# Patient Record
Sex: Female | Born: 1955 | Race: White | Hispanic: No | Marital: Married | Smoking: Never smoker
Health system: Southern US, Community
[De-identification: ages and names within clinical notes are randomized; demographics above are authoritative.]

## PROBLEM LIST (undated history)

## (undated) DIAGNOSIS — T7840XA Allergy, unspecified, initial encounter: Secondary | ICD-10-CM

## (undated) DIAGNOSIS — I671 Cerebral aneurysm, nonruptured: Secondary | ICD-10-CM

## (undated) DIAGNOSIS — L719 Rosacea, unspecified: Secondary | ICD-10-CM

## (undated) DIAGNOSIS — K219 Gastro-esophageal reflux disease without esophagitis: Secondary | ICD-10-CM

## (undated) DIAGNOSIS — Z8669 Personal history of other diseases of the nervous system and sense organs: Secondary | ICD-10-CM

## (undated) DIAGNOSIS — J302 Other seasonal allergic rhinitis: Secondary | ICD-10-CM

## (undated) HISTORY — DX: Personal history of other diseases of the nervous system and sense organs: Z86.69

## (undated) HISTORY — PX: TUBAL LIGATION: SHX77

## (undated) HISTORY — PX: BUNIONECTOMY: SHX129

## (undated) HISTORY — DX: Gastro-esophageal reflux disease without esophagitis: K21.9

## (undated) HISTORY — DX: Other seasonal allergic rhinitis: J30.2

## (undated) HISTORY — DX: Rosacea, unspecified: L71.9

## (undated) HISTORY — DX: Allergy, unspecified, initial encounter: T78.40XA

## (undated) HISTORY — PX: CARPAL TUNNEL RELEASE: SHX101

## (undated) HISTORY — DX: Cerebral aneurysm, nonruptured: I67.1

---

## 1999-01-17 ENCOUNTER — Other Ambulatory Visit: Admission: RE | Admit: 1999-01-17 | Discharge: 1999-01-17 | Payer: Self-pay | Admitting: Obstetrics and Gynecology

## 2000-11-18 ENCOUNTER — Other Ambulatory Visit: Admission: RE | Admit: 2000-11-18 | Discharge: 2000-11-18 | Payer: Self-pay | Admitting: *Deleted

## 2001-12-29 ENCOUNTER — Emergency Department (HOSPITAL_COMMUNITY): Admission: EM | Admit: 2001-12-29 | Discharge: 2001-12-30 | Payer: Self-pay | Admitting: Emergency Medicine

## 2002-02-16 ENCOUNTER — Encounter: Payer: Self-pay | Admitting: Family Medicine

## 2002-02-16 LAB — CONVERTED CEMR LAB
Blood Glucose, Fasting: 89 mg/dL
RBC count: 4.79 10*6/uL
WBC, blood: 5.2 10*3/uL

## 2002-06-19 ENCOUNTER — Other Ambulatory Visit: Admission: RE | Admit: 2002-06-19 | Discharge: 2002-06-19 | Payer: Self-pay | Admitting: Obstetrics and Gynecology

## 2002-07-07 ENCOUNTER — Encounter: Admission: RE | Admit: 2002-07-07 | Discharge: 2002-07-07 | Payer: Self-pay | Admitting: Family Medicine

## 2002-07-07 ENCOUNTER — Encounter: Payer: Self-pay | Admitting: Family Medicine

## 2002-10-08 HISTORY — PX: APPENDECTOMY: SHX54

## 2002-11-04 ENCOUNTER — Encounter (INDEPENDENT_AMBULATORY_CARE_PROVIDER_SITE_OTHER): Payer: Self-pay | Admitting: *Deleted

## 2002-11-04 ENCOUNTER — Inpatient Hospital Stay (HOSPITAL_COMMUNITY): Admission: EM | Admit: 2002-11-04 | Discharge: 2002-11-06 | Payer: Self-pay | Admitting: Emergency Medicine

## 2002-12-16 ENCOUNTER — Encounter: Payer: Self-pay | Admitting: Family Medicine

## 2002-12-16 LAB — CONVERTED CEMR LAB
Blood Glucose, Fasting: 77 mg/dL
RBC count: 4.6 10*6/uL
WBC, blood: 5.2 10*3/uL

## 2004-02-11 ENCOUNTER — Other Ambulatory Visit: Admission: RE | Admit: 2004-02-11 | Discharge: 2004-02-11 | Payer: Self-pay | Admitting: Obstetrics and Gynecology

## 2004-02-17 ENCOUNTER — Encounter: Payer: Self-pay | Admitting: Family Medicine

## 2004-02-17 LAB — CONVERTED CEMR LAB: TSH: 1.27 microintl units/mL

## 2005-01-15 ENCOUNTER — Encounter: Admission: RE | Admit: 2005-01-15 | Discharge: 2005-01-15 | Payer: Self-pay | Admitting: Obstetrics and Gynecology

## 2006-09-10 ENCOUNTER — Ambulatory Visit: Payer: Self-pay | Admitting: Family Medicine

## 2006-10-11 ENCOUNTER — Ambulatory Visit: Payer: Self-pay | Admitting: Family Medicine

## 2008-04-05 ENCOUNTER — Encounter: Payer: Self-pay | Admitting: Family Medicine

## 2008-05-13 ENCOUNTER — Encounter: Admission: RE | Admit: 2008-05-13 | Discharge: 2008-05-13 | Payer: Self-pay | Admitting: Obstetrics and Gynecology

## 2008-06-30 ENCOUNTER — Ambulatory Visit: Payer: Self-pay | Admitting: Family Medicine

## 2008-07-05 ENCOUNTER — Encounter: Payer: Self-pay | Admitting: Family Medicine

## 2008-07-05 DIAGNOSIS — N83209 Unspecified ovarian cyst, unspecified side: Secondary | ICD-10-CM | POA: Insufficient documentation

## 2008-07-05 DIAGNOSIS — E7800 Pure hypercholesterolemia, unspecified: Secondary | ICD-10-CM | POA: Insufficient documentation

## 2008-07-05 DIAGNOSIS — K219 Gastro-esophageal reflux disease without esophagitis: Secondary | ICD-10-CM | POA: Insufficient documentation

## 2008-07-05 DIAGNOSIS — E559 Vitamin D deficiency, unspecified: Secondary | ICD-10-CM | POA: Insufficient documentation

## 2009-01-24 ENCOUNTER — Ambulatory Visit: Payer: Self-pay | Admitting: Family Medicine

## 2009-01-24 DIAGNOSIS — F411 Generalized anxiety disorder: Secondary | ICD-10-CM | POA: Insufficient documentation

## 2009-01-24 DIAGNOSIS — G5600 Carpal tunnel syndrome, unspecified upper limb: Secondary | ICD-10-CM | POA: Insufficient documentation

## 2009-01-27 ENCOUNTER — Encounter: Payer: Self-pay | Admitting: Family Medicine

## 2009-02-28 ENCOUNTER — Ambulatory Visit: Payer: Self-pay | Admitting: Family Medicine

## 2009-05-31 ENCOUNTER — Ambulatory Visit: Payer: Self-pay | Admitting: Family Medicine

## 2009-06-22 ENCOUNTER — Ambulatory Visit (HOSPITAL_BASED_OUTPATIENT_CLINIC_OR_DEPARTMENT_OTHER): Admission: RE | Admit: 2009-06-22 | Discharge: 2009-06-22 | Payer: Self-pay | Admitting: *Deleted

## 2009-08-03 ENCOUNTER — Ambulatory Visit (HOSPITAL_BASED_OUTPATIENT_CLINIC_OR_DEPARTMENT_OTHER): Admission: RE | Admit: 2009-08-03 | Discharge: 2009-08-03 | Payer: Self-pay | Admitting: *Deleted

## 2009-08-23 ENCOUNTER — Ambulatory Visit: Payer: Self-pay | Admitting: Family Medicine

## 2009-09-07 ENCOUNTER — Ambulatory Visit: Payer: Self-pay | Admitting: Family Medicine

## 2009-11-14 ENCOUNTER — Telehealth: Payer: Self-pay | Admitting: Family Medicine

## 2010-05-16 ENCOUNTER — Encounter (INDEPENDENT_AMBULATORY_CARE_PROVIDER_SITE_OTHER): Payer: Self-pay | Admitting: *Deleted

## 2010-05-30 ENCOUNTER — Ambulatory Visit: Payer: Self-pay | Admitting: Family Medicine

## 2010-05-31 LAB — CONVERTED CEMR LAB
ALT: 12 units/L (ref 0–35)
AST: 18 units/L (ref 0–37)
Albumin: 4.1 g/dL (ref 3.5–5.2)
Alkaline Phosphatase: 71 units/L (ref 39–117)
BUN: 14 mg/dL (ref 6–23)
Bilirubin, Direct: 0.1 mg/dL (ref 0.0–0.3)
CO2: 30 meq/L (ref 19–32)
Calcium: 9.7 mg/dL (ref 8.4–10.5)
Chloride: 105 meq/L (ref 96–112)
Cholesterol: 220 mg/dL — ABNORMAL HIGH (ref 0–200)
Creatinine, Ser: 0.8 mg/dL (ref 0.4–1.2)
Direct LDL: 147.6 mg/dL
GFR calc non Af Amer: 85.06 mL/min (ref >60)
Glucose, Bld: 85 mg/dL (ref 70–99)
HDL: 50 mg/dL (ref >39.00)
Potassium: 4 meq/L (ref 3.5–5.1)
Sodium: 144 meq/L (ref 135–145)
Total Bilirubin: 0.9 mg/dL (ref 0.3–1.2)
Total CHOL/HDL Ratio: 4
Total Protein: 6.7 g/dL (ref 6.0–8.3)
Triglycerides: 143 mg/dL (ref 0.0–149.0)
VLDL: 28.6 mg/dL (ref 0.0–40.0)

## 2010-06-14 ENCOUNTER — Encounter: Admission: RE | Admit: 2010-06-14 | Discharge: 2010-06-14 | Payer: Self-pay | Admitting: Family Medicine

## 2010-11-05 LAB — CONVERTED CEMR LAB: Pap Smear: NORMAL

## 2010-11-09 NOTE — Progress Notes (Signed)
Summary: Rx Zoloft  Phone Note Refill Request Call back at [phone] Message from:  [facility] on November 14, 2009 8:00 AM  Refills Requested: Medication #1:  ZOLOFT 100 MG TABS one tab by mouth qd   Last Refilled: 09/07/2009 Received e-scribe refill request.   Method Requested: Electronic Initial call taken by: Sydell Axon LPN,  November 14, 2009 8:00 AM    Prescriptions: ZOLOFT 100 MG TABS (SERTRALINE HCL) one tab by mouth qd  #30 x 12   Entered and Authorized by:   Shaune Leeks MD   Signed by:   Shaune Leeks MD on 11/14/2009   Method used:   Electronically to        [facility] (retail)       [address]       [place], [place]  [address]       Ph: [phone]       Fax: [phone]   RxID:   [id number]

## 2010-11-09 NOTE — Letter (Signed)
Summary: Maria Aguirre letter  Maria Aguirre at [facility]  [address] [place], [place] [address]   Phone: [phone]  Fax: [phone]       05/16/2010 MRN: [id number]  Maria Aguirre [address] [place], [place]  [address]  Dear [name],  [facility] and [facility] announce the retirement of Arta Silence, M.D., from full-time practice at the [facility] office effective April 06, 2010 and his plans of returning part-time.  It is important to Dr. [name] and to our practice that you understand that [facility] has seven physicians in our office for your health care needs.  We will continue to offer the same exceptional care that you have today.    Dr. [name] has spoken to many of you about his plans for retirement and returning part-time in the fall.   We will continue to work with you through the transition to schedule appointments for you in the office and meet the high standards that [name] is committed to.   Again, it is with great pleasure that we share the news that Dr. [name] will return to [facility] in October of 2011 with a reduced schedule.    If you have any questions, or would like to request an appointment with one of our physicians, please call us at [phone] and press the option for Scheduling an appointment.  We take pleasure in providing you with excellent patient care and look forward to seeing you at your next office visit.  Our [facility] Physicians are:  Tillman Abide, M.D. Laurita Quint, M.D. Roxy Manns, M.D. Kerby Nora, M.D. Hannah Beat, M.D. Ruthe Mannan, M.D. We proudly welcomed Raechel Ache, M.D. and Eustaquio Boyden, M.D. to the practice in July/August 2011.  Sincerely,  [facility]

## 2010-11-09 NOTE — Assessment & Plan Note (Signed)
Summary: establish from [facility]/alc wants female dr.   Gaylyn Rong Signs:  Patient profile:   55 year old female Height:      65 inches Weight:      180.38 pounds BMI:     30.13 Temp:     98.5 degrees F oral Pulse rate:   76 / minute Pulse rhythm:   regular BP sitting:   120 / 80  (left arm) Cuff size:   regular  Vitals Entered By: Linde Gillis CMA Duncan Dull) (May 30, 2010 8:55 AM) CC: establish care from Dr. [name]   History of Present Illness: 55 yo female new to me here for CPX.  Well woman- had been going to see Dr. Billy Coast for years, last pap was in 02/2009.  No h/o abnormal pap smears, sexually active with her husband only. Never had a colonoscopy and she is due for mammogram and labs.  Anxiety/depression- was on Zoloft 100 mg daily but felt that she was "crying on the inside" while she was on it.  Feels much better since she weaned herself off of it.  Going through a lot of stress right now, her mother is sick and her father recently left her mother.  Seeing Madaline Guthrie for psychotherapy which has been really helping.  Current Medications (verified): 1)  Niacin Cr 500 Mg Cr-Caps (Niacin) .Marland Kitchen.. 1 Daily By Mouth 2)  Flax Seed Oil 1000 Mg Caps (Flaxseed (Linseed)) .Marland Kitchen.. 1 Daily By Mouth 3)  Bayer Aspirin Ec Low Dose 81 Mg Tbec (Aspirin) .Marland Kitchen.. 1 Daily By Mouth 4)  Vitamin D3 400 Unit Tabs (Cholecalciferol) .... Take One By Mouth Daily 5)  Calcium-Vitamin D 500-200 Mg-Unit Tabs (Calcium Carbonate-Vitamin D) .... Take One By Mouth Daily 6)  Super B Complex  Tabs (B Complex-C) .... Take One By Mouth Daily  Allergies (verified): No Known Drug Allergies  Past History:  Past Surgical History: Last updated: 07/05/2008 CT PELVIS W/   APPENDICITIS   3 CM SIMPLE APPEARING R OVARIAN CYST 11/04/2002 DEXA OSTEOPENIA SPINE( 04/05/2008)  Family History: Last updated: 07/05/2008 Father: ALIVE AT 13 YOA PROSTATE HYPERTROPHY (DR. Reuel Boom) Mother: ALIVE 5 YOA ELEVATED CHOLESTEROL BROTHER A 37    BROTHER A 50  SISTER A 41  CV: + P UNCLE DECEASED AT 72 YOA OF MI// PGF MI 97 YOA // PGM  ANGINA HBP: NEGATIVE DM: NEGATIVE GOUT/ARTHRITIS:  PROSTATE CANCER: NEGATIVE BREAST/OVARIAN/UTERINE CANCER: NEGATIVE COLON CANCER: + MGM DEPRESSION + SELF ? ETOH/DRUG ABUSE: NEGATIVE OTHER: POSITIVE STROKE MGF PARKINSONS: + MGF  Social History: Last updated: 07/05/2008 Marital Status: Married  LIVES WITH HUSBAND Children: 1 CHILD  Occupation: POST OFFICE MANAGER  Risk Factors: Smoking Status: never (07/05/2008)  Review of Systems      See HPI General:  Denies malaise. Eyes:  Denies blurring. ENT:  Denies difficulty swallowing. CV:  Denies chest pain or discomfort. Resp:  Denies shortness of breath. GI:  Denies abdominal pain, bloody stools, and change in bowel habits. GU:  Denies abnormal vaginal bleeding, discharge, and dysuria. MS:  Denies joint pain, joint redness, and joint swelling. Derm:  Denies rash. Neuro:  Denies headaches. Psych:  Denies panic attacks, sense of great danger, suicidal thoughts/plans, thoughts of violence, unusual visions or sounds, and thoughts /plans of harming others. Endo:  Denies cold intolerance and heat intolerance. Heme:  Denies abnormal bruising.  Physical Exam  General:  Well-developed,well-nourished,in no acute distress; alert,appropriate and cooperative throughout examination, relaxed and at ease. Head:  Normocephalic and atraumatic without obvious abnormalities. No apparent  alopecia or balding. Eyes:  Conjunctiva clear bilaterally.  Ears:  External ear exam shows no significant lesions or deformities.  Otoscopic examination reveals clear canals, tympanic membranes are intact bilaterally without bulging, retraction, inflammation or discharge. Hearing is grossly normal bilaterally. Nose:  External nasal examination shows no deformity or inflammation. Nasal mucosa are pink and moist without lesions or exudates. Mouth:  Oral mucosa and oropharynx  without lesions or exudates.  Teeth in good repair. Neck:  No deformities, masses, or tenderness noted. Lungs:  Normal respiratory effort, chest expands symmetrically. Lungs are clear to auscultation, no crackles or wheezes. Heart:  Normal rate and regular rhythm. S1 and S2 normal without gallop, murmur, click, rub or other extra sounds. Abdomen:  Bowel sounds positive,abdomen soft and non-tender without masses, organomegaly or hernias noted. Msk:  No deformity or scoliosis noted of thoracic or lumbar spine.   Extremities:  No clubbing, cyanosis, edema, or deformity noted with normal full range of motion of all joints.   Neurologic:  No cranial nerve deficits noted. Station and gait are normal. Plantar reflexes are down-going bilaterally. DTRs are symmetrical throughout. Sensory, motor and coordinative functions appear intact. Skin:  Intact without suspicious lesions or rashes Psych:  Cognition and judgment appear intact. Alert and cooperative with normal attention span and concentration. No apparent delusions, illusions, hallucinations   Impression & Recommendations:  Problem # 1:  PREVENTIVE HEALTH CARE (ICD-V70.0) Reviewed preventive care protocols, scheduled due services, and updated immunizations Discussed nutrition, exercise, diet, and healthy lifestyle.  Set up mammogram and colonscopy today. BMET, FLP, hepatic panel today. Orders: Venipuncture ([address]) TLB-BMP (Basic Metabolic Panel-BMET) (80048-METABOL)  Problem # 2:  ANXIETY STATE, UNSPECIFIED (ICD-300.00) Assessment: Improved Appears well controlled off Zoloft.  I encouraged her to continue psychotherapy with Madaline Guthrie. The following medications were removed from the medication list:    Zoloft 100 Mg Tabs (Sertraline hcl) ..... One tab by mouth qd  Complete Medication List: 1)  Niacin Cr 500 Mg Cr-caps (Niacin) .Marland Kitchen.. 1 daily by mouth 2)  Flax Seed Oil 1000 Mg Caps (Flaxseed (linseed)) .Marland Kitchen.. 1 daily by mouth 3)  Bayer Aspirin  Ec Low Dose 81 Mg Tbec (Aspirin) .Marland Kitchen.. 1 daily by mouth 4)  Vitamin D3 400 Unit Tabs (Cholecalciferol) .... Take one by mouth daily 5)  Calcium-vitamin D 500-200 Mg-unit Tabs (Calcium carbonate-vitamin d) .... Take one by mouth daily 6)  Super B Complex Tabs (B complex-c) .... Take one by mouth daily  Other Orders: Gastroenterology Referral (GI) Radiology Referral (Radiology) TLB-Lipid Panel (80061-LIPID) TLB-Hepatic/Liver Function Pnl (80076-HEPATIC)  Patient Instructions: 1)  Please stop by to see Shirlee Limerick on your way out.  Current Allergies (reviewed today): No known allergies  Last PAP:  Normal, Satisfactory (02/23/2009 2:58:41 PM) PAP Next Due:  2 yr   Prevention & Chronic Care Immunizations   Influenza vaccine: Fluvax 3+  (08/23/2009)   Influenza vaccine due: 08/23/2010    Tetanus booster: 05/31/2009: Tdap   Tetanus booster due: 06/01/2019    Pneumococcal vaccine: Not documented  Colorectal Screening   Hemoccult: Not documented   Hemoccult action/deferral: Not indicated  (05/30/2010)    Colonoscopy: Not documented   Colonoscopy action/deferral: GI referral  (05/30/2010)  Other Screening   Pap smear: Normal, Satisfactory  (02/23/2009)   Pap smear action/deferral: Deferred-2 yr interval  (05/30/2010)   Pap smear due: 02/24/2011    Mammogram: Normal Bilateral  (05/17/2009)   Mammogram action/deferral: Ordered  (05/30/2010)   Mammogram due: 05/2010   Smoking status: never  (  07/05/2008)  Lipids   Total Cholesterol: Not documented   Lipid panel action/deferral: Lipid Panel ordered   LDL: Not documented   LDL Direct: Not documented   HDL: Not documented   Triglycerides: Not documented    SGOT (AST): Not documented   BMP action: Ordered   SGPT (ALT): Not documented   Alkaline phosphatase: Not documented   Total bilirubin: Not documented  Self-Management Support :    Lipid self-management support: Not documented    Nursing Instructions: GI referral for  screening colonoscopy (see order) Schedule screening mammogram (see order)

## 2011-01-11 LAB — POCT HEMOGLOBIN-HEMACUE: Hemoglobin: 14.1 g/dL (ref 12.0–15.0)

## 2011-01-12 LAB — POCT HEMOGLOBIN-HEMACUE: Hemoglobin: 14.6 g/dL (ref 12.0–15.0)

## 2011-02-23 NOTE — Op Note (Signed)
NAMEETHYLE, TIEDT NO.:  [id number]   MEDICAL RECORD NO.:  [id number]                   PATIENT TYPE:  INP   LOCATION:  1843                                 FACILITY:  [facility]   PHYSICIAN:  Thornton Park. Daphine Deutscher, M.D.             DATE OF BIRTH:  03/08/1956   DATE OF PROCEDURE:  11/04/2002  DATE OF DISCHARGE:                                 OPERATIVE REPORT   PREOPERATIVE DIAGNOSIS:  Acute appendicitis.   POSTOPERATIVE DIAGNOSES:  1. Gangrenous appendicitis.  2. Three centimeter right ovarian cyst.   OPERATION PERFORMED:   SURGEON:  Matthew B. Daphine Deutscher, M.D.   ANESTHESIA:  General endotracheal.   DESCRIPTION OF OPERATION:  The patient got sick at about 0100 hours on  November 04, 2002.  She presented to the ED at 0630 hours on January 28 and  was seen in the ED, and felt to have a significant abdominal pain.  CT scan  was ordered, which indicated she had an appendicitis at 1600 hours and at  that time she was posted for a laparoscopic appendectomy.  Because the  schedule was full I was unable to get the case started before 2130 hours.  The patient at that time was taken to room 16 and given general anesthesia.  Preoperatively she had received a gram of Mefoxin.   The abdomen was prepped with Betadine and draped sterilely.  A transverse  incision was made through an old laparoscopy incision in the navel and I  entered through a longitudinal slit in the fascia without difficulty.  The  Hasson trocar was inserted.  The abdomen was insufflated and a 1011 was  placed obliquely in the left lower quadrant and a 5 mm in the right upper  quadrant.   I surveyed the abdomen and she had an obvious appendicitis and she had about  a 3 cm right ovarian cyst.  It appeared to be regular simple cyst grossly.   The appendix was stuck down and twisted back on itself, and was quite turgid  and purple, and it is depicted in the chart with the pictures taken.  First  I got around the base and transected this with an endo-GIA.  I mobilized  that and then went across the mesentery with a combination harmonic scalpel  and finally with the endo-GIA.  Once it was detached it was placed in a bag  and brought out through the umbilicus.  It had a very feted odor as I was  removing it.  There was no gross spillage or perforation; however, it was  necrotic as evidenced in the picture.   Following removal I went in an irrigated this area well.  No bleeding was  noted with a three-minute irrigant.  I then repaired the umbilical defect  under direct vision with the laparoscope using two sutures of 0-Vicryl into  the fascia.  A survey again  surveyed the abdomen.  Everything appeared to be in order.  I  deflated the abdomen and removed the trocars, and closed the skin with 4-0  Vicryls.   The patient seemed to tolerated the procedure well and was taken to the  recovery room in satisfactory condition.                                                 Thornton Park Daphine Deutscher, M.D.    MBM/MEDQ  D:  11/04/2002  T:  11/05/2002  Job:  [id number]

## 2012-02-01 ENCOUNTER — Ambulatory Visit (INDEPENDENT_AMBULATORY_CARE_PROVIDER_SITE_OTHER): Payer: No Typology Code available for payment source | Admitting: Family Medicine

## 2012-02-01 ENCOUNTER — Telehealth: Payer: Self-pay

## 2012-02-01 VITALS — BP 113/72 | HR 72 | Temp 98.2°F | Resp 16 | Ht 64.0 in | Wt 172.0 lb

## 2012-02-01 DIAGNOSIS — J329 Chronic sinusitis, unspecified: Secondary | ICD-10-CM

## 2012-02-01 DIAGNOSIS — J310 Chronic rhinitis: Secondary | ICD-10-CM

## 2012-02-01 DIAGNOSIS — R059 Cough, unspecified: Secondary | ICD-10-CM

## 2012-02-01 DIAGNOSIS — R05 Cough: Secondary | ICD-10-CM

## 2012-02-01 MED ORDER — FLUTICASONE PROPIONATE 50 MCG/ACT NA SUSP: 2.0000 | Freq: Every day | NASAL | Status: DC

## 2012-02-01 MED ORDER — BENZONATATE 100 MG PO CAPS: 200.0000 mg | ORAL_CAPSULE | Freq: Two times a day (BID) | ORAL | Status: AC | PRN

## 2012-02-01 MED ORDER — AZITHROMYCIN 250 MG PO TABS: ORAL_TABLET | ORAL | Status: AC

## 2012-02-01 NOTE — Progress Notes (Signed)
  [facility]:  Office Visit  Chief Complaint:  Chief Complaint  Patient presents with  . Nasal Congestion    x 1 week  . Cough    HPI: Maria Aguirre is a 56 y.o. female who complains of 1 week h/o sinus congestion. No fevers, chills. Dry cough. Feels chest pressure. Froggy throat, losing voice. SInus drainage and pain. Tried Dayquil, nyquil with minimal relief. HAs had flu vaccine.   History reviewed. No pertinent past medical history. Past Surgical History  Procedure Date  . Appendectomy    History   Social History  . Marital Status: Married    Spouse Name: N/A    Number of Children: N/A  . Years of Education: N/A   Social History Main Topics  . Smoking status: Never Smoker   . Smokeless tobacco: None  . Alcohol Use: No  . Drug Use: No  . Sexually Active: None   Other Topics Concern  . None   Social History Narrative  . None   Family History  Problem Relation Age of Onset  . Heart disease Father    No Known Allergies Prior to Admission medications   Not on File     ROS: The patient denies fevers, chills, night sweats, unintentional weight loss, chest pain, palpitations, wheezing, dyspnea on exertion, nausea, vomiting, abdominal pain, dysuria, hematuria, melena, numbness, weakness, or tingling.  All other systems have been reviewed and were otherwise negative with the exception of those mentioned in the HPI and as above.    PHYSICAL EXAM: Filed Vitals:   02/01/12 1548  BP: 113/72  Pulse: 72  Temp: 98.2 F (36.8 C)  Resp: 16  Spo2 98% Filed Vitals:   02/01/12 1548  Height: 5\' 4"  (1.626 m)  Weight: 172 lb (78.019 kg)   Body mass index is 29.52 kg/(m^2).  General: Alert, no acute distress HEENT:  Normocephalic, atraumatic, oropharynx patent. Tm nl, boggy red nares, + sinus tenderness bilateral frontal and max Cardiovascular:  Regular rate and rhythm, no rubs murmurs or gallops.  No Carotid bruits, radial pulse intact. No pedal  edema.  Respiratory: Clear to auscultation bilaterally.  No wheezes, rales, or rhonchi.  No cyanosis, no use of accessory musculature GI: No organomegaly, abdomen is soft and non-tender, positive bowel sounds.  No masses. Skin: No rashes. Neurologic: Facial musculature symmetric. Psychiatric: Patient is appropriate throughout our interaction. Lymphatic: No cervical lymphadenopathy Musculoskeletal: Gait intact.   LABS:    EKG/XRAY:   Primary read interpreted by Dr. Conley Rolls at [facility].   ASSESSMENT/PLAN: Encounter Diagnoses  Name Primary?  . Sinusitis Yes  . Rhinitis   . Cough    1. Rx Z-pack 2. Rx Flonase 3. Rx Tessalon Graylin Shiver PHUONG, DO 02/01/2012 4:30 PM

## 2013-02-17 ENCOUNTER — Ambulatory Visit: Payer: No Typology Code available for payment source | Admitting: Family Medicine

## 2013-02-17 VITALS — BP 120/80 | HR 90 | Temp 98.5°F | Resp 16 | Ht 64.0 in | Wt 168.0 lb

## 2013-02-17 DIAGNOSIS — W57XXXA Bitten or stung by nonvenomous insect and other nonvenomous arthropods, initial encounter: Secondary | ICD-10-CM

## 2013-02-17 DIAGNOSIS — T148 Other injury of unspecified body region: Secondary | ICD-10-CM

## 2013-02-17 MED ORDER — DOXYCYCLINE HYCLATE 100 MG PO CAPS: 100.0000 mg | ORAL_CAPSULE | Freq: Two times a day (BID) | ORAL | Status: DC

## 2013-02-17 MED ORDER — DESONIDE 0.05 % EX CREA: TOPICAL_CREAM | Freq: Two times a day (BID) | CUTANEOUS | Status: DC

## 2013-02-17 NOTE — Progress Notes (Signed)
[facility] [address] [place] [address] [phone]  Date:  02/17/2013   Name:  Maria Aguirre   DOB:  [birth year]   MRN:  [id number]  PCP:  Eustaquio Boyden, MD    Chief Complaint: Tick Bite   History of Present Illness:  Maria Aguirre is a 57 y.o. very pleasant female patient who presents with the following:  She pulled a tick off her neck 2 days ago.  It is a dog tick- not engorged.  She brought it with her today and it was indeed a dog tick.    She felt tired yesterday, but she did heavy yard work over the weekend as well.  She does part- time landscaping work.    She has not had a fever, but the area where she was bitten is warm and slightly tender.   Otherwise she is generally healthy.  NKDA She would like a RF of her desonide cream to use on the bite., as well on occasional skin irritation   Patient Active Problem List   Diagnosis Date Noted  . ANXIETY STATE, UNSPECIFIED 01/24/2009  . CARPAL TUNNEL SYNDROME, BILATERAL 01/24/2009  . UNSPECIFIED VITAMIN D DEFICIENCY 07/05/2008  . HYPERCHOLESTEROLEMIA 07/05/2008  . GERD 07/05/2008  . OVARIAN CYST 3CM RIGHT 07/05/2008    Past Medical History  Diagnosis Date  . Allergy     Past Surgical History  Procedure Laterality Date  . Appendectomy    . Tubal ligation    . Bunionectomy      History  Substance Use Topics  . Smoking status: Never Smoker   . Smokeless tobacco: Not on file  . Alcohol Use: No    Family History  Problem Relation Age of Onset  . Heart disease Father     No Known Allergies  Medication list has been reviewed and updated.  No current outpatient prescriptions on file prior to visit.   No current facility-administered medications on file prior to visit.    Review of Systems:  As per HPI- otherwise negative.   Physical Examination: Filed Vitals:   02/17/13 1216  BP: 120/80  Pulse: 90  Temp: 98.5 F (36.9 C)  Resp: 16   Filed Vitals:   02/17/13  1216  Height: 5\' 4"  (1.626 m)  Weight: 168 lb (76.204 kg)   Body mass index is 28.82 kg/(m^2). Ideal Body Weight: Weight in (lb) to have BMI = 25: 145.3  GEN: WDWN, NAD, Non-toxic, A & O x 3 HEENT: Atraumatic, Normocephalic. Neck supple. No masses, No LAD.  No oral lesions.   Ears and Nose: No external deformity. CV: RRR, No M/G/R. No JVD. No thrill. No extra heart sounds. PULM: CTA B, no wheezes, crackles, rhonchi. No retractions. No resp. distress. No accessory muscle use. ABD: S, NT, ND EXTR: No c/c/e NEURO Normal gait.  PSYCH: Normally interactive. Conversant. Not depressed or anxious appearing.  Calm demeanor.  No rash on hands or feet.  On the right back of her neck is a slightly tender and red insect bite. No cellulitis or abscess.    Assessment and Plan: Tick bite - Plan: doxycycline (VIBRAMYCIN) 100 MG capsule, desonide (DESOWEN) 0.05 % cream  Reassurance that most tick bites do not lead to serious consequences.  Refilled her desonide cream to use prn.   Doxycycline rx sent to drug store to hold- she may use this if she develops any sign or sx of RMSF, which we went over in detail.  In this case she is also to call or RTC right away.    Signed Abbe Amsterdam, MD

## 2013-02-17 NOTE — Patient Instructions (Addendum)
I do not think you will suffer any ill effects from the tick bite.  If you develop any symptoms such as fever, chills, or other flu- like symptoms please start the doxycycline and let me know right away.    Be careful of sun sensitivity if you need to use the doxycycline.

## 2013-12-30 ENCOUNTER — Ambulatory Visit (INDEPENDENT_AMBULATORY_CARE_PROVIDER_SITE_OTHER): Payer: No Typology Code available for payment source | Admitting: Family Medicine

## 2013-12-30 VITALS — BP 140/78 | HR 79 | Temp 98.7°F | Resp 16 | Ht 64.5 in | Wt 172.4 lb

## 2013-12-30 DIAGNOSIS — J209 Acute bronchitis, unspecified: Secondary | ICD-10-CM

## 2013-12-30 MED ORDER — GUAIFENESIN-CODEINE 100-10 MG/5ML PO SOLN: 5.0000 mL | ORAL | Status: DC | PRN

## 2013-12-30 MED ORDER — AZITHROMYCIN 250 MG PO TABS
ORAL_TABLET | ORAL | Status: DC
Start: 2013-12-30 — End: 2015-01-10

## 2013-12-30 NOTE — Progress Notes (Signed)
   Subjective:    Patient ID: Maria Aguirre, female    DOB: 10-14-1955, 58 y.o.   MRN: [id number]  HPI Patient presents with illness. Has been sick for one week. Had fever last week, thought she had the flu. Coughing a lot and still with sore throat. Cough is dry. Chest now getting tight and not getting better. Taking cough drops and gargling. Tried OTC cough/cold meds. No h/o lung disease. Mild wheezing with strenuous activity. No fevers now. No vomiting or diarrhea. No sick contacts, but works at [employer].  Review of Systems  All other systems reviewed and are negative.       Objective:   Physical Exam  Constitutional: She is oriented to person, place, and time. She appears well-developed and well-nourished. No distress.  HENT:  Head: Normocephalic and atraumatic.  Right Ear: External ear normal.  Left Ear: External ear normal.  Mouth/Throat: Oropharynx is clear and moist. No oropharyngeal exudate.  Eyes: Conjunctivae are normal. Pupils are equal, round, and reactive to light. No scleral icterus.  Neck: Normal range of motion. Neck supple.  Cardiovascular: Normal rate, regular rhythm, normal heart sounds and intact distal pulses.   Pulmonary/Chest: Effort normal and breath sounds normal. No respiratory distress. She has no wheezes. She has no rales.  Musculoskeletal: Normal range of motion.  Lymphadenopathy:    She has no cervical adenopathy.  Neurological: She is alert and oriented to person, place, and time.  Skin: Skin is warm and dry. She is not diaphoretic.  Psychiatric: She has a normal mood and affect. Her behavior is normal.      Assessment & Plan:  #1. Viral illness/Bronchitis - Continue conservative care  - Cough medicine  - Mucinex D  - Tylenol/Motrin/cough drops - Start zpak if not improving by 10 days into illness on Saturday - F/u prn

## 2013-12-30 NOTE — Patient Instructions (Signed)
Thank you for coming in today  I think that you have a virus and bronchitis Try cough medicine at night Take Mucinex D during the day Start azithromycin if you have not turned the corner by Saturday Continue cough drops, cold fluids, ibuprofen/tylenol  Followup for recurrent fever, worsening breathing problems, vomiting, worsening illness  Bronchitis Bronchitis is inflammation of the airways that extend from the windpipe into the lungs (bronchi). The inflammation often causes mucus to develop, which leads to a cough. If the inflammation becomes severe, it may cause shortness of breath. CAUSES  Bronchitis may be caused by:   Viral infections.   Bacteria.   Cigarette smoke.   Allergens, pollutants, and other irritants.  SIGNS AND SYMPTOMS  The most common symptom of bronchitis is a frequent cough that produces mucus. Other symptoms include:  Fever.   Body aches.   Chest congestion.   Chills.   Shortness of breath.   Sore throat.  DIAGNOSIS  Bronchitis is usually diagnosed through a medical history and physical exam. Tests, such as chest X-rays, are sometimes done to rule out other conditions.  TREATMENT  You may need to avoid contact with whatever caused the problem (smoking, for example). Medicines are sometimes needed. These may include:  Antibiotics. These may be prescribed if the condition is caused by bacteria.  Cough suppressants. These may be prescribed for relief of cough symptoms.   Inhaled medicines. These may be prescribed to help open your airways and make it easier for you to breathe.   Steroid medicines. These may be prescribed for those with recurrent (chronic) bronchitis. HOME CARE INSTRUCTIONS  Get plenty of rest.   Drink enough fluids to keep your urine clear or pale yellow (unless you have a medical condition that requires fluid restriction). Increasing fluids may help thin your secretions and will prevent dehydration.   Only take  over-the-counter or prescription medicines as directed by your health care provider.  Only take antibiotics as directed. Make sure you finish them even if you start to feel better.  Avoid secondhand smoke, irritating chemicals, and strong fumes. These will make bronchitis worse. If you are a smoker, quit smoking. Consider using nicotine gum or skin patches to help control withdrawal symptoms. Quitting smoking will help your lungs heal faster.   Put a cool-mist humidifier in your bedroom at night to moisten the air. This may help loosen mucus. Change the water in the humidifier daily. You can also run the hot water in your shower and sit in the bathroom with the door closed for 5 10 minutes.   Follow up with your health care provider as directed.   Wash your hands frequently to avoid catching bronchitis again or spreading an infection to others.  SEEK MEDICAL CARE IF: Your symptoms do not improve after 1 week of treatment.  SEEK IMMEDIATE MEDICAL CARE IF:  Your fever increases.  You have chills.   You have chest pain.   You have worsening shortness of breath.   You have bloody sputum.  You faint.  You have lightheadedness.  You have a severe headache.   You vomit repeatedly. MAKE SURE YOU:   Understand these instructions.  Will watch your condition.  Will get help right away if you are not doing well or get worse. Document Released: 09/24/2005 Document Revised: 07/15/2013 Document Reviewed: 05/19/2013 [facility] Patient Information 2014 [place].

## 2014-05-06 ENCOUNTER — Encounter: Payer: Self-pay | Admitting: Family Medicine

## 2015-01-10 ENCOUNTER — Encounter: Payer: Self-pay | Admitting: Internal Medicine

## 2015-01-10 ENCOUNTER — Ambulatory Visit (INDEPENDENT_AMBULATORY_CARE_PROVIDER_SITE_OTHER): Payer: PRIVATE HEALTH INSURANCE | Admitting: Family Medicine

## 2015-01-10 ENCOUNTER — Encounter: Payer: Self-pay | Admitting: Family Medicine

## 2015-01-10 ENCOUNTER — Other Ambulatory Visit (HOSPITAL_COMMUNITY)
Admission: RE | Admit: 2015-01-10 | Discharge: 2015-01-10 | Disposition: A | Discharge status: Home / Self Care | Payer: No Typology Code available for payment source | Source: Ambulatory Visit | Attending: Family Medicine | Admitting: Family Medicine

## 2015-01-10 VITALS — BP 104/70 | HR 80 | Temp 98.2°F | Ht 64.5 in | Wt 156.5 lb

## 2015-01-10 DIAGNOSIS — F411 Generalized anxiety disorder: Secondary | ICD-10-CM | POA: Diagnosis not present

## 2015-01-10 DIAGNOSIS — Z0000 Encounter for general adult medical examination without abnormal findings: Secondary | ICD-10-CM | POA: Diagnosis not present

## 2015-01-10 DIAGNOSIS — Z01419 Encounter for gynecological examination (general) (routine) without abnormal findings: Secondary | ICD-10-CM | POA: Diagnosis not present

## 2015-01-10 DIAGNOSIS — Z1211 Encounter for screening for malignant neoplasm of colon: Secondary | ICD-10-CM

## 2015-01-10 DIAGNOSIS — E7800 Pure hypercholesterolemia, unspecified: Secondary | ICD-10-CM

## 2015-01-10 DIAGNOSIS — Z1151 Encounter for screening for human papillomavirus (HPV): Secondary | ICD-10-CM | POA: Diagnosis present

## 2015-01-10 DIAGNOSIS — E559 Vitamin D deficiency, unspecified: Secondary | ICD-10-CM

## 2015-01-10 DIAGNOSIS — I671 Cerebral aneurysm, nonruptured: Secondary | ICD-10-CM | POA: Insufficient documentation

## 2015-01-10 DIAGNOSIS — Z1239 Encounter for other screening for malignant neoplasm of breast: Secondary | ICD-10-CM | POA: Diagnosis not present

## 2015-01-10 DIAGNOSIS — Z124 Encounter for screening for malignant neoplasm of cervix: Secondary | ICD-10-CM

## 2015-01-10 LAB — LIPID PANEL
Cholesterol: 180 mg/dL (ref 0–200)
HDL: 54.9 mg/dL (ref >39.00)
LDL Cholesterol: 114 mg/dL — ABNORMAL HIGH (ref 0–99)
NonHDL: 125.1
Total CHOL/HDL Ratio: 3
Triglycerides: 58 mg/dL (ref 0.0–149.0)
VLDL: 11.6 mg/dL (ref 0.0–40.0)

## 2015-01-10 LAB — BASIC METABOLIC PANEL
BUN: 15 mg/dL (ref 6–23)
CO2: 29 mEq/L (ref 19–32)
Calcium: 9.4 mg/dL (ref 8.4–10.5)
Chloride: 106 mEq/L (ref 96–112)
Creatinine, Ser: 0.79 mg/dL (ref 0.40–1.20)
GFR: 78.83 mL/min (ref >60.00)
Glucose, Bld: 82 mg/dL (ref 70–99)
Potassium: 4 mEq/L (ref 3.5–5.1)
Sodium: 140 mEq/L (ref 135–145)

## 2015-01-10 LAB — TSH: TSH: 1.02 u[IU]/mL (ref 0.35–4.50)

## 2015-01-10 LAB — HM PAP SMEAR: HM Pap smear: NORMAL

## 2015-01-10 LAB — VITAMIN D 25 HYDROXY (VIT D DEFICIENCY, FRACTURES): VITD: 31.12 ng/mL (ref 30.00–100.00)

## 2015-01-10 NOTE — Assessment & Plan Note (Signed)
Recheck today. 

## 2015-01-10 NOTE — Progress Notes (Signed)
BP 104/70 mmHg  Pulse 80  Temp(Src) 98.2 F (36.8 C) (Oral)  Ht 5' 4.5" (1.638 m)  Wt 156 lb 8 oz (70.988 kg)  BMI 26.46 kg/m2   CC: CPE  Subjective:    Patient ID: Maria Aguirre, female    DOB: 10-12-1955, 59 y.o.   MRN: [id number]  HPI: Maria Aguirre is a 59 y.o. female presenting on 01/10/2015 for Annual Exam   Last seen here by Dr Deborra Medina 2011.   Erythematous skin rash on face - ?rosacea. Desonide didn't help. Daughter with same hx. Has appt tomorrow with Dr Allyson Sabal.  Due for eye exam. Regularly sees dentist - Q3 months.   Preventative: Colon cancer screening - discussed, will schedule colonoscopy Breast cancer screening - mammo last done and normal 2011. Will set up for this. Well woman exam - prior saw Dr Ronita Hipps. Normal pap smears in the past - requests well woman exam today. DEXA scan 2009 - spine -1.3 Flu shot -  Tetanus shot - 05/2009 Shingles shot -  Seat belt use discussed Sunscreen use and skin screen discussed.  Lives with husband Edd Arbour) and mother, dog Occupation: retired from post office, working full time at [employer] Activity: walking at work, [employer], Haematologist Diet: good water, fruits/vegetables daily  Relevant past medical, surgical, family and social history reviewed and updated as indicated. Interim medical history since our last visit reviewed. Allergies and medications reviewed and updated. Current Outpatient Prescriptions on File Prior to Visit  Medication Sig  . calcium carbonate 200 MG capsule Take 250 mg by mouth 2 (two) times daily with a meal.  . cholecalciferol (VITAMIN D) 1000 UNITS tablet Take 1,000 Units by mouth 2 (two) times daily.  Marland Kitchen desonide (DESOWEN) 0.05 % cream Apply topically 2 (two) times daily.  . diphenhydrAMINE (BENADRYL) 25 mg capsule Take 25 mg by mouth at bedtime.  . vitamin C (ASCORBIC ACID) 500 MG tablet Take 500 mg by mouth 2 (two) times daily.   No current facility-administered medications on file prior to visit.      Review of Systems  Constitutional: Negative for fever, chills, activity change, appetite change, fatigue and unexpected weight change.  HENT: Negative for hearing loss.   Eyes: Negative for visual disturbance.  Respiratory: Negative for cough, chest tightness, shortness of breath and wheezing.   Cardiovascular: Negative for chest pain, palpitations and leg swelling.  Gastrointestinal: Negative for nausea, vomiting, abdominal pain, diarrhea, constipation, blood in stool and abdominal distention.  Genitourinary: Negative for hematuria and difficulty urinating.  Musculoskeletal: Negative for myalgias, arthralgias and neck pain.  Skin: Negative for rash.  Neurological: Negative for dizziness, seizures, syncope and headaches.  Hematological: Negative for adenopathy. Does not bruise/bleed easily.  Psychiatric/Behavioral: Negative for dysphoric mood. The patient is not nervous/anxious.    Per HPI unless specifically indicated above     Objective:    BP 104/70 mmHg  Pulse 80  Temp(Src) 98.2 F (36.8 C) (Oral)  Ht 5' 4.5" (1.638 m)  Wt 156 lb 8 oz (70.988 kg)  BMI 26.46 kg/m2  Wt Readings from Last 3 Encounters:  01/10/15 156 lb 8 oz (70.988 kg)  12/30/13 172 lb 6.4 oz (78.2 kg)  02/17/13 168 lb (76.204 kg)    Physical Exam  Constitutional: She is oriented to person, place, and time. She appears well-developed and well-nourished. No distress.  HENT:  Head: Normocephalic and atraumatic.  Right Ear: Hearing, tympanic membrane, external ear and ear canal normal.  Left Ear: Hearing, tympanic  membrane, external ear and ear canal normal.  Nose: Nose normal.  Mouth/Throat: Uvula is midline, oropharynx is clear and moist and mucous membranes are normal. No oropharyngeal exudate, posterior oropharyngeal edema or posterior oropharyngeal erythema.  Eyes: Conjunctivae and EOM are normal. Pupils are equal, round, and reactive to light. No scleral icterus.  Neck: Normal range of motion. Neck  supple. Carotid bruit is not present. No thyromegaly present.  Cardiovascular: Normal rate, regular rhythm, normal heart sounds and intact distal pulses.   No murmur heard. Pulses:      Radial pulses are 2+ on the right side, and 2+ on the left side.  Pulmonary/Chest: Effort normal and breath sounds normal. No respiratory distress. She has no wheezes. She has no rales. Right breast exhibits no inverted nipple, no mass, no nipple discharge and no skin change. Left breast exhibits no inverted nipple, no mass, no nipple discharge and no skin change.  Abdominal: Soft. Bowel sounds are normal. She exhibits no distension and no mass. There is no tenderness. There is no rebound and no guarding.  Genitourinary: Vagina normal and uterus normal. Pelvic exam was performed with patient supine. There is no rash, tenderness or lesion on the right labia. There is no rash, tenderness or lesion on the left labia. Cervix exhibits no motion tenderness, no discharge and no friability. Right adnexum displays no mass, no tenderness and no fullness. Left adnexum displays no mass, no tenderness and no fullness.  Musculoskeletal: Normal range of motion. She exhibits no edema.  Lymphadenopathy:       Head (right side): No submental, no submandibular, no tonsillar, no preauricular and no posterior auricular adenopathy present.       Head (left side): No submental, no submandibular, no tonsillar, no preauricular and no posterior auricular adenopathy present.    She has no cervical adenopathy.    She has no axillary adenopathy.       Right axillary: No lateral adenopathy present.       Left axillary: No lateral adenopathy present.      Right: No supraclavicular adenopathy present.       Left: No supraclavicular adenopathy present.  Neurological: She is alert and oriented to person, place, and time.  CN grossly intact, station and gait intact  Skin: Skin is warm and dry. No rash noted.  Psychiatric: She has a normal mood and  affect. Her behavior is normal. Judgment and thought content normal.  Nursing note and vitals reviewed.  Results for orders placed or performed in visit on 05/30/10  [facility]  Result Value Ref Range   Cholesterol 220 (H) 0-200 mg/dL   Triglycerides 143.0 0.0-149.0 mg/dL   HDL 50.00 >39.00 mg/dL   VLDL 28.6 0.0-40.0 mg/dL   Total CHOL/HDL Ratio 4    Total Bilirubin 0.9 0.3-1.2 mg/dL   Bilirubin, Direct 0.1 0.0-0.3 mg/dL   Alkaline Phosphatase 71 39-117 units/L   AST 18 0-37 units/L   ALT 12 0-35 units/L   Total Protein 6.7 6.0-8.3 g/dL   Albumin 4.1 3.5-5.2 g/dL   Sodium 144 135-145 meq/L   Potassium 4.0 3.5-5.1 meq/L   Chloride 105 96-112 meq/L   CO2 30 19-32 meq/L   Glucose, Bld 85 70-99 mg/dL   BUN 14 6-23 mg/dL   Creatinine, Ser 0.8 0.4-1.2 mg/dL   Calcium 9.7 8.4-10.5 mg/dL   GFR calc non Af Amer 85.06 >60 mL/min   Direct LDL 147.6 mg/dL      Assessment & Plan:   Problem List Items  Addressed This Visit    None       Follow up plan: No Follow-up on file.

## 2015-01-10 NOTE — Progress Notes (Signed)
Pre visit review using our clinic review tool, if applicable. No additional management support is needed unless otherwise documented below in the visit note. 

## 2015-01-10 NOTE — Addendum Note (Signed)
Addended by: Marchia Bond on: 01/10/2015 04:16 PM   Modules accepted: Miquel Dunn

## 2015-01-10 NOTE — Addendum Note (Signed)
Addended by: Royann Shivers A on: 01/10/2015 11:06 AM   Modules accepted: Orders

## 2015-01-10 NOTE — Patient Instructions (Addendum)
Blood work today. We will set you up for colonoscopy - expect a call over next few weeks. Call your insurance about the shingles shot to see if it is covered or how much it would cost and where is cheaper (here or pharmacy).  If you want to receive here, call for nurse visit.  Good to meet you today, call us with questions. Return as needed or in 1-2 year for next physical.

## 2015-01-10 NOTE — Assessment & Plan Note (Signed)
Preventative protocols reviewed and updated unless pt declined. Discussed healthy diet and lifestyle.  

## 2015-01-10 NOTE — Assessment & Plan Note (Signed)
Very stable off meds.

## 2015-01-11 ENCOUNTER — Encounter: Payer: Self-pay | Admitting: *Deleted

## 2015-01-11 LAB — CYTOLOGY - PAP

## 2015-01-13 ENCOUNTER — Encounter: Payer: Self-pay | Admitting: *Deleted

## 2015-01-17 ENCOUNTER — Ambulatory Visit
Admission: RE | Admit: 2015-01-17 | Discharge: 2015-01-17 | Disposition: A | Discharge status: Home / Self Care | Payer: PRIVATE HEALTH INSURANCE | Source: Ambulatory Visit | Attending: Family Medicine | Admitting: Family Medicine

## 2015-01-17 DIAGNOSIS — Z1239 Encounter for other screening for malignant neoplasm of breast: Secondary | ICD-10-CM

## 2015-01-18 ENCOUNTER — Encounter: Payer: Self-pay | Admitting: Family Medicine

## 2015-01-18 LAB — HM MAMMOGRAPHY

## 2015-03-22 ENCOUNTER — Encounter: Payer: PRIVATE HEALTH INSURANCE | Admitting: Internal Medicine

## 2015-04-25 ENCOUNTER — Ambulatory Visit (AMBULATORY_SURGERY_CENTER): Payer: Self-pay | Admitting: *Deleted

## 2015-04-25 VITALS — Ht 64.5 in | Wt 157.2 lb

## 2015-04-25 DIAGNOSIS — Z1211 Encounter for screening for malignant neoplasm of colon: Secondary | ICD-10-CM

## 2015-04-25 MED ORDER — MOVIPREP 100 G PO SOLR: ORAL | Status: DC

## 2015-04-25 NOTE — Progress Notes (Signed)
No egg or soy allergy  No anesthesia or intubation problems per pt  No diet medications taken  Registered in EMMI   

## 2015-05-04 ENCOUNTER — Encounter: Payer: PRIVATE HEALTH INSURANCE | Admitting: Internal Medicine

## 2015-06-07 ENCOUNTER — Ambulatory Visit: Payer: PRIVATE HEALTH INSURANCE | Admitting: Family Medicine

## 2015-07-12 ENCOUNTER — Encounter: Payer: PRIVATE HEALTH INSURANCE | Admitting: Gastroenterology

## 2016-03-06 ENCOUNTER — Other Ambulatory Visit (HOSPITAL_BASED_OUTPATIENT_CLINIC_OR_DEPARTMENT_OTHER): Payer: Self-pay | Admitting: Family Medicine

## 2016-03-06 DIAGNOSIS — Z1231 Encounter for screening mammogram for malignant neoplasm of breast: Secondary | ICD-10-CM

## 2016-03-13 ENCOUNTER — Ambulatory Visit (HOSPITAL_BASED_OUTPATIENT_CLINIC_OR_DEPARTMENT_OTHER)
Admission: RE | Admit: 2016-03-13 | Discharge: 2016-03-13 | Disposition: A | Discharge status: Home / Self Care | Payer: No Typology Code available for payment source | Source: Ambulatory Visit | Attending: Family Medicine | Admitting: Family Medicine

## 2016-03-13 ENCOUNTER — Other Ambulatory Visit (HOSPITAL_BASED_OUTPATIENT_CLINIC_OR_DEPARTMENT_OTHER): Payer: Self-pay | Admitting: Family Medicine

## 2016-03-13 DIAGNOSIS — Z1231 Encounter for screening mammogram for malignant neoplasm of breast: Secondary | ICD-10-CM

## 2017-08-22 ENCOUNTER — Other Ambulatory Visit: Payer: Self-pay | Admitting: *Deleted

## 2017-08-22 DIAGNOSIS — R309 Painful micturition, unspecified: Secondary | ICD-10-CM

## 2017-08-28 ENCOUNTER — Ambulatory Visit: Payer: PRIVATE HEALTH INSURANCE | Admitting: Family Medicine

## 2017-09-03 ENCOUNTER — Encounter: Payer: Self-pay | Admitting: Gastroenterology

## 2017-09-03 ENCOUNTER — Encounter: Payer: Self-pay | Admitting: Family Medicine

## 2017-09-03 ENCOUNTER — Ambulatory Visit (INDEPENDENT_AMBULATORY_CARE_PROVIDER_SITE_OTHER): Payer: No Typology Code available for payment source | Admitting: Family Medicine

## 2017-09-03 VITALS — BP 118/70 | HR 71 | Temp 97.9°F | Ht 64.0 in | Wt 162.0 lb

## 2017-09-03 DIAGNOSIS — E559 Vitamin D deficiency, unspecified: Secondary | ICD-10-CM

## 2017-09-03 DIAGNOSIS — L719 Rosacea, unspecified: Secondary | ICD-10-CM | POA: Diagnosis not present

## 2017-09-03 DIAGNOSIS — Z0000 Encounter for general adult medical examination without abnormal findings: Secondary | ICD-10-CM | POA: Diagnosis not present

## 2017-09-03 DIAGNOSIS — E7800 Pure hypercholesterolemia, unspecified: Secondary | ICD-10-CM | POA: Diagnosis not present

## 2017-09-03 DIAGNOSIS — Z1211 Encounter for screening for malignant neoplasm of colon: Secondary | ICD-10-CM | POA: Diagnosis not present

## 2017-09-03 DIAGNOSIS — N281 Cyst of kidney, acquired: Secondary | ICD-10-CM | POA: Insufficient documentation

## 2017-09-03 DIAGNOSIS — R109 Unspecified abdominal pain: Secondary | ICD-10-CM | POA: Diagnosis not present

## 2017-09-03 LAB — CBC WITH DIFFERENTIAL/PLATELET
Basophils Absolute: 0 10*3/uL (ref 0.0–0.1)
Basophils Relative: 0.9 % (ref 0.0–3.0)
Eosinophils Absolute: 0.1 10*3/uL (ref 0.0–0.7)
Eosinophils Relative: 1.3 % (ref 0.0–5.0)
HCT: 42.2 % (ref 36.0–46.0)
Hemoglobin: 14.1 g/dL (ref 12.0–15.0)
Lymphocytes Relative: 32 % (ref 12.0–46.0)
Lymphs Abs: 1.5 10*3/uL (ref 0.7–4.0)
MCHC: 33.4 g/dL (ref 30.0–36.0)
MCV: 87.2 fl (ref 78.0–100.0)
Monocytes Absolute: 0.4 10*3/uL (ref 0.1–1.0)
Monocytes Relative: 8.6 % (ref 3.0–12.0)
Neutro Abs: 2.8 10*3/uL (ref 1.4–7.7)
Neutrophils Relative %: 57.2 % (ref 43.0–77.0)
Platelets: 225 10*3/uL (ref 150.0–400.0)
RBC: 4.84 Mil/uL (ref 3.87–5.11)
RDW: 13.6 % (ref 11.5–15.5)
WBC: 4.8 10*3/uL (ref 4.0–10.5)

## 2017-09-03 LAB — POC URINALSYSI DIPSTICK (AUTOMATED)
Bilirubin, UA: NEGATIVE
Blood, UA: NEGATIVE
Glucose, UA: NEGATIVE
Ketones, UA: NEGATIVE
Leukocytes, UA: NEGATIVE
Nitrite, UA: NEGATIVE
Protein, UA: NEGATIVE
Spec Grav, UA: 1.02 (ref 1.010–1.025)
Urobilinogen, UA: 0.2 E.U./dL
pH, UA: 6 (ref 5.0–8.0)

## 2017-09-03 LAB — LIPID PANEL
Cholesterol: 201 mg/dL — ABNORMAL HIGH (ref 0–200)
HDL: 50 mg/dL (ref >39.00)
LDL Cholesterol: 135 mg/dL — ABNORMAL HIGH (ref 0–99)
NonHDL: 150.72
Total CHOL/HDL Ratio: 4
Triglycerides: 78 mg/dL (ref 0.0–149.0)
VLDL: 15.6 mg/dL (ref 0.0–40.0)

## 2017-09-03 LAB — COMPREHENSIVE METABOLIC PANEL
ALT: 6 U/L (ref 0–35)
AST: 12 U/L (ref 0–37)
Albumin: 3.9 g/dL (ref 3.5–5.2)
Alkaline Phosphatase: 75 U/L (ref 39–117)
BUN: 16 mg/dL (ref 6–23)
CO2: 30 mEq/L (ref 19–32)
Calcium: 9.4 mg/dL (ref 8.4–10.5)
Chloride: 106 mEq/L (ref 96–112)
Creatinine, Ser: 0.81 mg/dL (ref 0.40–1.20)
GFR: 75.92 mL/min (ref >60.00)
Glucose, Bld: 92 mg/dL (ref 70–99)
Potassium: 4 mEq/L (ref 3.5–5.1)
Sodium: 141 mEq/L (ref 135–145)
Total Bilirubin: 0.5 mg/dL (ref 0.2–1.2)
Total Protein: 6.7 g/dL (ref 6.0–8.3)

## 2017-09-03 LAB — VITAMIN D 25 HYDROXY (VIT D DEFICIENCY, FRACTURES): VITD: 23.78 ng/mL — ABNORMAL LOW (ref 30.00–100.00)

## 2017-09-03 LAB — TSH: TSH: 1.54 u[IU]/mL (ref 0.35–4.50)

## 2017-09-03 NOTE — Assessment & Plan Note (Addendum)
Preventative protocols reviewed and updated unless pt declined. Discussed healthy diet and lifestyle.  Never followed through with colonoscopy - life got in the way. Referral placed today. She is motivated to complete screening.

## 2017-09-03 NOTE — Assessment & Plan Note (Signed)
Chronic, off meds. Update FLP today.  

## 2017-09-03 NOTE — Assessment & Plan Note (Signed)
Followed by derm. 

## 2017-09-03 NOTE — Assessment & Plan Note (Addendum)
Longstanding, progressively worsening. Benign exam today. UA today normal. Check labs today (CBC, CMP). Low threshold to image given longevity of sxs (CT abd/pelvis).

## 2017-09-03 NOTE — Assessment & Plan Note (Signed)
Encouraged goal 1000 iu daily.

## 2017-09-03 NOTE — Patient Instructions (Addendum)
Labs today.  See Rosaria Ferries for GI appointment for colonoscopy.  Call to schedule mammogram at [facility].  If interested, check with pharmacy about new 2 shot shingles series (shingrix).  We may do further imaging for flank pain pending labs.  Good to see you today, call us with questions.  Health Maintenance, Female Adopting a healthy lifestyle and getting preventive care can go a long way to promote health and wellness. Talk with your health care provider about what schedule of regular examinations is right for you. This is a good chance for you to check in with your provider about disease prevention and staying healthy. In between checkups, there are plenty of things you can do on your own. Experts have done a lot of research about which lifestyle changes and preventive measures are most likely to keep you healthy. Ask your health care provider for more information. Weight and diet Eat a healthy diet  Be sure to include plenty of vegetables, fruits, low-fat dairy products, and lean protein.  Do not eat a lot of foods high in solid fats, added sugars, or salt.  Get regular exercise. This is one of the most important things you can do for your health. ? Most adults should exercise for at least 150 minutes each week. The exercise should increase your heart rate and make you sweat (moderate-intensity exercise). ? Most adults should also do strengthening exercises at least twice a week. This is in addition to the moderate-intensity exercise.  Maintain a healthy weight  Body mass index (BMI) is a measurement that can be used to identify possible weight problems. It estimates body fat based on height and weight. Your health care provider can help determine your BMI and help you achieve or maintain a healthy weight.  For females 60 years of age and older: ? A BMI below 18.5 is considered underweight. ? A BMI of 18.5 to 24.9 is normal. ? A BMI of 25 to 29.9 is considered overweight. ? A BMI of  30 and above is considered obese.  Watch levels of cholesterol and blood lipids  You should start having your blood tested for lipids and cholesterol at 63 years of age, then have this test every 5 years.  You may need to have your cholesterol levels checked more often if: ? Your lipid or cholesterol levels are high. ? You are older than 64 years of age. ? You are at high risk for heart disease.  Cancer screening Lung Cancer  Lung cancer screening is recommended for adults 45-25 years old who are at high risk for lung cancer because of a history of smoking.  A yearly low-dose CT scan of the lungs is recommended for people who: ? Currently smoke. ? Have quit within the past 15 years. ? Have at least a 30-pack-year history of smoking. A pack year is smoking an average of one pack of cigarettes a day for 1 year.  Yearly screening should continue until it has been 15 years since you quit.  Yearly screening should stop if you develop a health problem that would prevent you from having lung cancer treatment.  Breast Cancer  Practice breast self-awareness. This means understanding how your breasts normally appear and feel.  It also means doing regular breast self-exams. Let your health care provider know about any changes, no matter how small.  If you are in your 20s or 30s, you should have a clinical breast exam (CBE) by a health care provider every 1-3 years as  part of a regular health exam.  If you are 40 or older, have a CBE every year. Also consider having a breast X-ray (mammogram) every year.  If you have a family history of breast cancer, talk to your health care provider about genetic screening.  If you are at high risk for breast cancer, talk to your health care provider about having an MRI and a mammogram every year.  Breast cancer gene (BRCA) assessment is recommended for women who have family members with BRCA-related cancers. BRCA-related cancers  include: ? Breast. ? Ovarian. ? Tubal. ? Peritoneal cancers.  Results of the assessment will determine the need for genetic counseling and BRCA1 and BRCA2 testing.  Cervical Cancer Your health care provider may recommend that you be screened regularly for cancer of the pelvic organs (ovaries, uterus, and vagina). This screening involves a pelvic examination, including checking for microscopic changes to the surface of your cervix (Pap test). You may be encouraged to have this screening done every 3 years, beginning at age 66.  For women ages 23-65, health care providers may recommend pelvic exams and Pap testing every 3 years, or they may recommend the Pap and pelvic exam, combined with testing for human papilloma virus (HPV), every 5 years. Some types of HPV increase your risk of cervical cancer. Testing for HPV may also be done on women of any age with unclear Pap test results.  Other health care providers may not recommend any screening for nonpregnant women who are considered low risk for pelvic cancer and who do not have symptoms. Ask your health care provider if a screening pelvic exam is right for you.  If you have had past treatment for cervical cancer or a condition that could lead to cancer, you need Pap tests and screening for cancer for at least 20 years after your treatment. If Pap tests have been discontinued, your risk factors (such as having a new sexual partner) need to be reassessed to determine if screening should resume. Some women have medical problems that increase the chance of getting cervical cancer. In these cases, your health care provider may recommend more frequent screening and Pap tests.  Colorectal Cancer  This type of cancer can be detected and often prevented.  Routine colorectal cancer screening usually begins at 63 years of age and continues through 63 years of age.  Your health care provider may recommend screening at an earlier age if you have risk factors  for colon cancer.  Your health care provider may also recommend using home test kits to check for hidden blood in the stool.  A small camera at the end of a tube can be used to examine your colon directly (sigmoidoscopy or colonoscopy). This is done to check for the earliest forms of colorectal cancer.  Routine screening usually begins at age 84.  Direct examination of the colon should be repeated every 5-10 years through 63 years of age. However, you may need to be screened more often if early forms of precancerous polyps or small growths are found.  Skin Cancer  Check your skin from head to toe regularly.  Tell your health care provider about any new moles or changes in moles, especially if there is a change in a mole's shape or color.  Also tell your health care provider if you have a mole that is larger than the size of a pencil eraser.  Always use sunscreen. Apply sunscreen liberally and repeatedly throughout the day.  Protect yourself by wearing  long sleeves, pants, a wide-brimmed hat, and sunglasses whenever you are outside.  Heart disease, diabetes, and high blood pressure  High blood pressure causes heart disease and increases the risk of stroke. High blood pressure is more likely to develop in: ? People who have blood pressure in the high end of the normal range (130-139/85-89 mm Hg). ? People who are overweight or obese. ? People who are African American.  If you are 84-53 years of age, have your blood pressure checked every 3-5 years. If you are 36 years of age or older, have your blood pressure checked every year. You should have your blood pressure measured twice-once when you are at a hospital or clinic, and once when you are not at a hospital or clinic. Record the average of the two measurements. To check your blood pressure when you are not at a hospital or clinic, you can use: ? An automated blood pressure machine at a pharmacy. ? A home blood pressure monitor.  If  you are between 81 years and 46 years old, ask your health care provider if you should take aspirin to prevent strokes.  Have regular diabetes screenings. This involves taking a blood sample to check your fasting blood sugar level. ? If you are at a normal weight and have a low risk for diabetes, have this test once every three years after 63 years of age. ? If you are overweight and have a high risk for diabetes, consider being tested at a younger age or more often. Preventing infection Hepatitis B  If you have a higher risk for hepatitis B, you should be screened for this virus. You are considered at high risk for hepatitis B if: ? You were born in a country where hepatitis B is common. Ask your health care provider which countries are considered high risk. ? Your parents were born in a high-risk country, and you have not been immunized against hepatitis B (hepatitis B vaccine). ? You have HIV or AIDS. ? You use needles to inject street drugs. ? You live with someone who has hepatitis B. ? You have had sex with someone who has hepatitis B. ? You get hemodialysis treatment. ? You take certain medicines for conditions, including cancer, organ transplantation, and autoimmune conditions.  Hepatitis C  Blood testing is recommended for: ? Everyone born from [birth year] through 1967. ? Anyone with known risk factors for hepatitis C.  Sexually transmitted infections (STIs)  You should be screened for sexually transmitted infections (STIs) including gonorrhea and chlamydia if: ? You are sexually active and are younger than 63 years of age. ? You are older than 63 years of age and your health care provider tells you that you are at risk for this type of infection. ? Your sexual activity has changed since you were last screened and you are at an increased risk for chlamydia or gonorrhea. Ask your health care provider if you are at risk.  If you do not have HIV, but are at risk, it may be recommended  that you take a prescription medicine daily to prevent HIV infection. This is called pre-exposure prophylaxis (PrEP). You are considered at risk if: ? You are sexually active and do not regularly use condoms or know the HIV status of your partner(s). ? You take drugs by injection. ? You are sexually active with a partner who has HIV.  Talk with your health care provider about whether you are at high risk of being infected with HIV.  If you choose to begin PrEP, you should first be tested for HIV. You should then be tested every 3 months for as long as you are taking PrEP. Pregnancy  If you are premenopausal and you may become pregnant, ask your health care provider about preconception counseling.  If you may become pregnant, take 400 to 800 micrograms (mcg) of folic acid every day.  If you want to prevent pregnancy, talk to your health care provider about birth control (contraception). Osteoporosis and menopause  Osteoporosis is a disease in which the bones lose minerals and strength with aging. This can result in serious bone fractures. Your risk for osteoporosis can be identified using a bone density scan.  If you are 8 years of age or older, or if you are at risk for osteoporosis and fractures, ask your health care provider if you should be screened.  Ask your health care provider whether you should take a calcium or vitamin D supplement to lower your risk for osteoporosis.  Menopause may have certain physical symptoms and risks.  Hormone replacement therapy may reduce some of these symptoms and risks. Talk to your health care provider about whether hormone replacement therapy is right for you. Follow these instructions at home:  Schedule regular health, dental, and eye exams.  Stay current with your immunizations.  Do not use any tobacco products including cigarettes, chewing tobacco, or electronic cigarettes.  If you are pregnant, do not drink alcohol.  If you are  breastfeeding, limit how much and how often you drink alcohol.  Limit alcohol intake to no more than 1 drink per day for nonpregnant women. One drink equals 12 ounces of beer, 5 ounces of wine, or 1 ounces of hard liquor.  Do not use street drugs.  Do not share needles.  Ask your health care provider for help if you need support or information about quitting drugs.  Tell your health care provider if you often feel depressed.  Tell your health care provider if you have ever been abused or do not feel safe at home. This information is not intended to replace advice given to you by your health care provider. Make sure you discuss any questions you have with your health care provider. Document Released: 04/09/2011 Document Revised: 03/01/2016 Document Reviewed: 06/28/2015 Elsevier Interactive Patient Education  [employer].

## 2017-09-03 NOTE — Progress Notes (Signed)
BP 118/70 (BP Location: Left Arm, Patient Position: Sitting, Cuff Size: Normal)   Pulse 71   Temp 97.9 F (36.6 C) (Oral)   Ht 5' 4" (1.626 m)   Wt 162 lb (73.5 kg)   SpO2 98%   BMI 27.81 kg/m    CC: CPE Subjective:    Patient ID: Maria Aguirre, female    DOB: 1956/05/19, 61 y.o.   MRN: [id number]  HPI: Maria Aguirre is a 61 y.o. female presenting on 09/03/2017 for Annual Exam and Flank Pain (right. Started about 1 yr ago, intermittent and more frequent. Thinks may be kidney stone)   Intermittent R flank pain over the past year. Progressively worsening over the last 3 months. Dull ache, becoming more constant. Feels better when she increases water intake. No fevers/chills, hematuria, urgency, dysuria, nausea/vomiting, diarrhea, bowel changes. Denies inciting trauma/injury. No unexpected weight changes.   F/u derm appt with Dr Allyson Sabal scheduled next week for rosacea.   Fasting today.   Preventative: Colon cancer screening - discussed, never scheduled colonoscopy.  Breast cancer screening - mammo normal 03/2016. Breast exams at home.  Well woman exam - prior saw Dr Ronita Hipps. Normal pap smears in the past - latest 2016. No pelvic pain, vaginal bleeding.  DEXA scan 2009 - spine -1.3 Flu shot - 2018 Tetanus shot - 05/2009 shingrix - discussed Seat belt use discussed Sunscreen use and skin screen discussed. 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  Activity: walking at work, [employer], [employer]  Diet: good water, fruits/vegetables daily    Relevant past medical, surgical, family and social history reviewed and updated as indicated. Interim medical history since our last visit reviewed. Allergies and medications reviewed and updated. Outpatient Medications Prior to Visit  Medication Sig Dispense Refill  . Calcium  Carbonate 260 MG CHEW Chew 2 tablets by mouth 2 (two) times daily.    . cholecalciferol (VITAMIN D) 1000 UNITS tablet Take 1,000 Units by mouth 2 (two) times daily.    Marland Kitchen desonide (DESOWEN) 0.05 % cream Apply topically 2 (two) times daily. 30 g 0  . diphenhydrAMINE (BENADRYL) 25 mg capsule Take 25 mg by mouth at bedtime.    Marland Kitchen MOVIPREP 100 G SOLR Moviprep as directed, no substitutions 1 kit 0  . vitamin C (ASCORBIC ACID) 500 MG tablet Take 500 mg by mouth 2 (two) times daily.     No facility-administered medications prior to visit.      Per HPI unless specifically indicated in ROS section below Review of Systems  Constitutional: Negative for activity change, appetite change, chills, fatigue, fever and unexpected weight change.  HENT: Negative for hearing loss.   Eyes: Negative for visual disturbance.  Respiratory: Negative for cough, chest tightness, shortness of breath and wheezing.   Cardiovascular: Negative for chest pain, palpitations and leg swelling.  Gastrointestinal: Negative for abdominal distention, abdominal pain, blood in stool, constipation, diarrhea, nausea and vomiting.  Genitourinary: Negative for difficulty urinating and hematuria.  Musculoskeletal: Negative for arthralgias, myalgias and neck pain.  Skin: Negative for rash.  Neurological: Negative for dizziness, seizures, syncope and headaches.  Hematological: Negative for adenopathy. Bruises/bleeds easily.  Psychiatric/Behavioral: Negative for dysphoric mood. The patient is not nervous/anxious.        Objective:    BP 118/70 (BP Location: Left Arm, Patient Position: Sitting, Cuff Size: Normal)  Pulse 71   Temp 97.9 F (36.6 C) (Oral)   Ht 5' 4" (1.626 m)   Wt 162 lb (73.5 kg)   SpO2 98%   BMI 27.81 kg/m   Wt Readings from Last 3 Encounters:  09/03/17 162 lb (73.5 kg)  04/25/15 157 lb 3.2 oz (71.3 kg)  01/10/15 156 lb 8 oz (71 kg)    Physical Exam  Constitutional: She is oriented to person, place, and time.  She appears well-developed and well-nourished. No distress.  HENT:  Head: Normocephalic and atraumatic.  Right Ear: Hearing, tympanic membrane, external ear and ear canal normal.  Left Ear: Hearing, tympanic membrane, external ear and ear canal normal.  Nose: Nose normal.  Mouth/Throat: Uvula is midline, oropharynx is clear and moist and mucous membranes are normal. No oropharyngeal exudate, posterior oropharyngeal edema or posterior oropharyngeal erythema.  Eyes: Conjunctivae and EOM are normal. Pupils are equal, round, and reactive to light. No scleral icterus.  Neck: Normal range of motion. Neck supple.  Cardiovascular: Normal rate, regular rhythm, normal heart sounds and intact distal pulses.  No murmur heard. Pulses:      Radial pulses are 2+ on the right side, and 2+ on the left side.  Pulmonary/Chest: Effort normal and breath sounds normal. No respiratory distress. She has no wheezes. She has no rales.  Abdominal: Soft. Bowel sounds are normal. She exhibits no distension and no mass. There is no tenderness. There is no rebound and no guarding.  Musculoskeletal: Normal range of motion. She exhibits no edema.  Lymphadenopathy:    She has no cervical adenopathy.  Neurological: She is alert and oriented to person, place, and time.  CN grossly intact, station and gait intact  Skin: Skin is warm and dry. No rash noted.  Psychiatric: She has a normal mood and affect. Her behavior is normal. Judgment and thought content normal.  Nursing note and vitals reviewed.  Results for orders placed or performed in visit on 09/03/17  POCT Urinalysis Dipstick (Automated)  Result Value Ref Range   Color, UA yellow    Clarity, UA clear    Glucose, UA negative    Bilirubin, UA negative    Ketones, UA negative    Spec Grav, UA 1.020 1.010 - 1.025   Blood, UA negative    pH, UA 6.0 5.0 - 8.0   Protein, UA negative    Urobilinogen, UA 0.2 0.2 or 1.0 E.U./dL   Nitrite, UA negative    Leukocytes, UA  Negative Negative      Assessment & Plan:   Problem List Items Addressed This Visit    Health maintenance examination - Primary    Preventative protocols reviewed and updated unless pt declined. Discussed healthy diet and lifestyle.  Never followed through with colonoscopy - life got in the way. Referral placed today. She is motivated to complete screening.      HYPERCHOLESTEROLEMIA    Chronic, off meds. Update FLP today.       Relevant Orders   Lipid panel   Comprehensive metabolic panel   TSH   Right flank pain    Longstanding, progressively worsening. Benign exam today. UA today normal. Check labs today (CBC, CMP). Low threshold to image given longevity of sxs (CT abd/pelvis).       Relevant Orders   Comprehensive metabolic panel   CBC with Differential/Platelet   POCT Urinalysis Dipstick (Automated) (Completed)   Rosacea    Followed by derm.       Vitamin D deficiency  Encouraged goal 1000 iu daily.       Relevant Orders   VITAMIN D 25 Hydroxy (Vit-D Deficiency, Fractures)    Other Visit Diagnoses    Special screening for malignant neoplasms, colon       Relevant Orders   Ambulatory referral to Gastroenterology       Follow up plan: Return in about 1 year (around 09/03/2018) for annual exam, prior fasting for blood work.  Ria Bush, MD

## 2017-09-05 ENCOUNTER — Telehealth: Payer: Self-pay | Admitting: Family Medicine

## 2017-09-05 DIAGNOSIS — R109 Unspecified abdominal pain: Secondary | ICD-10-CM

## 2017-09-05 NOTE — Telephone Encounter (Signed)
Given ongoing pain, will order abd [place] to eval RUQ and R kidneys. Labs were reassuring - see result note.

## 2017-09-05 NOTE — Telephone Encounter (Signed)
Copied from [place] [id number]. Topic: General - Other >> Sep 05, 2017  1:04 PM Yvette Rack wrote: Reason for CRM: patient calling to let Dr Danise Mina know that she is having RT back pain and that he stated that he might send her to get a U/S and she state that she is waiting on the rest of her lab results

## 2017-09-06 NOTE — Telephone Encounter (Signed)
Called patient and got her scheduled at [facility] for 09/07/17.

## 2017-09-07 ENCOUNTER — Ambulatory Visit (HOSPITAL_BASED_OUTPATIENT_CLINIC_OR_DEPARTMENT_OTHER)
Admission: RE | Admit: 2017-09-07 | Discharge: 2017-09-07 | Disposition: A | Discharge status: Home / Self Care | Payer: No Typology Code available for payment source | Source: Ambulatory Visit | Attending: Family Medicine | Admitting: Family Medicine

## 2017-09-07 DIAGNOSIS — N2889 Other specified disorders of kidney and ureter: Secondary | ICD-10-CM | POA: Insufficient documentation

## 2017-09-07 DIAGNOSIS — R109 Unspecified abdominal pain: Secondary | ICD-10-CM

## 2017-09-07 DIAGNOSIS — K7689 Other specified diseases of liver: Secondary | ICD-10-CM | POA: Insufficient documentation

## 2017-09-08 ENCOUNTER — Other Ambulatory Visit: Payer: Self-pay | Admitting: Family Medicine

## 2017-09-08 DIAGNOSIS — N2889 Other specified disorders of kidney and ureter: Secondary | ICD-10-CM

## 2017-09-09 NOTE — Addendum Note (Signed)
Addended by: Ria Bush on: 09/09/2017 05:15 PM   Modules accepted: Orders

## 2017-09-10 ENCOUNTER — Telehealth: Payer: Self-pay

## 2017-09-10 NOTE — Telephone Encounter (Signed)
Copied from [place] [id number]. Topic: Inquiry >> Sep 10, 2017  4:52 PM Oliver Pila B wrote: Reason for CRM: pt called to ask WHY is she having the mri done, contact pt to advise

## 2017-09-10 NOTE — Telephone Encounter (Signed)
Spoke with patient regarding US findings and reasoning behind MRI.

## 2017-09-10 NOTE — Telephone Encounter (Signed)
Attempted to return pt's call, no answer. Left message on vm informing pt I will call back tomorrow.

## 2017-09-11 NOTE — Telephone Encounter (Signed)
Noted  

## 2017-09-13 ENCOUNTER — Ambulatory Visit (HOSPITAL_COMMUNITY): Payer: No Typology Code available for payment source

## 2017-09-13 ENCOUNTER — Ambulatory Visit (HOSPITAL_COMMUNITY)
Admission: RE | Admit: 2017-09-13 | Discharge: 2017-09-13 | Disposition: A | Discharge status: Home / Self Care | Payer: No Typology Code available for payment source | Source: Ambulatory Visit | Attending: Family Medicine | Admitting: Family Medicine

## 2017-09-13 DIAGNOSIS — N2889 Other specified disorders of kidney and ureter: Secondary | ICD-10-CM

## 2017-09-13 MED ORDER — GADOBENATE DIMEGLUMINE 529 MG/ML IV SOLN
15.0000 mL | Freq: Once | INTRAVENOUS | Status: DC | PRN
Start: 2017-09-13 — End: 2017-09-14

## 2017-09-14 ENCOUNTER — Other Ambulatory Visit (HOSPITAL_COMMUNITY): Payer: Self-pay | Admitting: Diagnostic Radiology

## 2017-09-14 DIAGNOSIS — Z5309 Procedure and treatment not carried out because of other contraindication: Secondary | ICD-10-CM

## 2017-09-17 ENCOUNTER — Inpatient Hospital Stay (HOSPITAL_COMMUNITY): Admission: RE | Admit: 2017-09-17 | Payer: No Typology Code available for payment source | Source: Ambulatory Visit

## 2017-09-17 ENCOUNTER — Ambulatory Visit (HOSPITAL_COMMUNITY): Payer: No Typology Code available for payment source

## 2017-09-17 ENCOUNTER — Ambulatory Visit (HOSPITAL_COMMUNITY)
Admission: RE | Admit: 2017-09-17 | Discharge: 2017-09-17 | Disposition: A | Discharge status: Home / Self Care | Payer: No Typology Code available for payment source | Source: Ambulatory Visit | Attending: Diagnostic Radiology | Admitting: Diagnostic Radiology

## 2017-09-17 ENCOUNTER — Ambulatory Visit (HOSPITAL_COMMUNITY)
Admission: RE | Admit: 2017-09-17 | Discharge: 2017-09-17 | Disposition: A | Discharge status: Home / Self Care | Payer: No Typology Code available for payment source | Source: Ambulatory Visit | Attending: Family Medicine | Admitting: Family Medicine

## 2017-09-17 DIAGNOSIS — Z135 Encounter for screening for eye and ear disorders: Secondary | ICD-10-CM | POA: Insufficient documentation

## 2017-09-17 DIAGNOSIS — Q619 Cystic kidney disease, unspecified: Secondary | ICD-10-CM | POA: Insufficient documentation

## 2017-09-17 DIAGNOSIS — K7689 Other specified diseases of liver: Secondary | ICD-10-CM | POA: Insufficient documentation

## 2017-09-17 DIAGNOSIS — N2889 Other specified disorders of kidney and ureter: Secondary | ICD-10-CM | POA: Diagnosis present

## 2017-09-17 DIAGNOSIS — Z5309 Procedure and treatment not carried out because of other contraindication: Secondary | ICD-10-CM

## 2017-09-17 MED ORDER — GADOBENATE DIMEGLUMINE 529 MG/ML IV SOLN
15.0000 mL | Freq: Once | INTRAVENOUS | Status: AC | PRN
  Administered 2017-09-17: 15 mL via INTRAVENOUS

## 2017-09-18 ENCOUNTER — Encounter: Payer: Self-pay | Admitting: Family Medicine

## 2017-10-08 HISTORY — PX: COLONOSCOPY: SHX174

## 2017-10-15 ENCOUNTER — Ambulatory Visit (AMBULATORY_SURGERY_CENTER): Payer: Self-pay | Admitting: *Deleted

## 2017-10-15 ENCOUNTER — Other Ambulatory Visit: Payer: Self-pay

## 2017-10-15 VITALS — Ht 64.0 in | Wt 170.0 lb

## 2017-10-15 DIAGNOSIS — Z1211 Encounter for screening for malignant neoplasm of colon: Secondary | ICD-10-CM

## 2017-10-15 MED ORDER — NA SULFATE-K SULFATE-MG SULF 17.5-3.13-1.6 GM/177ML PO SOLN: ORAL | 0 refills | Status: DC

## 2017-10-15 NOTE — Progress Notes (Signed)
Patient denies any allergies to eggs or soy. Patient denies any problems with anesthesia/sedation. Patient denies any oxygen use at home. Patient denies taking any diet/weight loss medications or blood thinners. EMMI education assisgned to patient on colonoscopy, this was explained and instructions given to patient. Patient has Movi prep at home, she will check to see if it has expired if not she may use this or Suprep. Suprep coupon printed and given to pt.

## 2017-10-16 ENCOUNTER — Encounter: Payer: Self-pay | Admitting: Gastroenterology

## 2017-10-22 ENCOUNTER — Ambulatory Visit (AMBULATORY_SURGERY_CENTER): Payer: No Typology Code available for payment source | Admitting: Gastroenterology

## 2017-10-22 ENCOUNTER — Encounter: Payer: Self-pay | Admitting: Gastroenterology

## 2017-10-22 VITALS — BP 110/53 | HR 60 | Temp 98.0°F | Resp 16 | Ht 64.0 in | Wt 170.0 lb

## 2017-10-22 DIAGNOSIS — D125 Benign neoplasm of sigmoid colon: Secondary | ICD-10-CM

## 2017-10-22 DIAGNOSIS — D120 Benign neoplasm of cecum: Secondary | ICD-10-CM

## 2017-10-22 DIAGNOSIS — D122 Benign neoplasm of ascending colon: Secondary | ICD-10-CM | POA: Diagnosis not present

## 2017-10-22 DIAGNOSIS — Z1211 Encounter for screening for malignant neoplasm of colon: Secondary | ICD-10-CM

## 2017-10-22 DIAGNOSIS — Z1212 Encounter for screening for malignant neoplasm of rectum: Secondary | ICD-10-CM | POA: Diagnosis not present

## 2017-10-22 DIAGNOSIS — K635 Polyp of colon: Secondary | ICD-10-CM

## 2017-10-22 MED ORDER — SODIUM CHLORIDE 0.9 % IV SOLN
500.0000 mL | Freq: Once | INTRAVENOUS | Status: DC
Start: 2017-10-22 — End: 2019-12-08

## 2017-10-22 NOTE — Progress Notes (Signed)
Spontaneous respirations throughout. VSS. Resting comfortably. To PACU on room air. Report to  RN. 

## 2017-10-22 NOTE — Op Note (Signed)
[facility] Patient Name: Maria Aguirre Procedure Date: 10/22/2017 12:01 PM MRN: [id number] Endoscopist: Remo Lipps P. Finian Helvey MD, MD Age: 62 Referring MD:  Date of Birth: 1956-05-25 Gender: Female Account #: [id number] Procedure:                Colonoscopy Indications:              Screening for colorectal malignant neoplasm, This                            is the patient's first colonoscopy Medicines:                Monitored Anesthesia Care Procedure:                Pre-Anesthesia Assessment:                           - Prior to the procedure, a History and Physical                            was performed, and patient medications and                            allergies were reviewed. The patient's tolerance of                            previous anesthesia was also reviewed. The risks                            and benefits of the procedure and the sedation                            options and risks were discussed with the patient.                            All questions were answered, and informed consent                            was obtained. Prior Anticoagulants: The patient has                            taken no previous anticoagulant or antiplatelet                            agents. ASA Grade Assessment: II - A patient with                            mild systemic disease. After reviewing the risks                            and benefits, the patient was deemed in                            satisfactory condition to undergo the procedure.  After obtaining informed consent, the colonoscope                            was passed under direct vision. Throughout the                            procedure, the patient's blood pressure, pulse, and                            oxygen saturations were monitored continuously. The                            Colonoscope was introduced through the anus and                            advanced to the the  cecum, identified by                            appendiceal orifice and ileocecal valve. The                            colonoscopy was performed without difficulty. The                            patient tolerated the procedure well. The quality                            of the bowel preparation was good. The ileocecal                            valve, appendiceal orifice, and rectum were                            photographed. Scope In: 12:08:52 PM Scope Out: 12:32:14 PM Scope Withdrawal Time: 0 hours 20 minutes 8 seconds  Total Procedure Duration: 0 hours 23 minutes 22 seconds  Findings:                 The perianal and digital rectal examinations were                            normal.                           The colon was tortuous.                           A 4 mm polyp was found in the cecum. The polyp was                            sessile. The polyp was removed with a cold snare.                            Resection and retrieval were complete.  A 5 mm polyp was found in the ascending colon. The                            polyp was sessile. The polyp was removed with a                            cold snare. Resection and retrieval were complete.                           A 3 mm polyp was found in the sigmoid colon. The                            polyp was sessile. The polyp was removed with a                            cold snare. Resection and retrieval were complete.                           Scattered small-mouthed diverticula were found in                            the sigmoid colon.                           Internal hemorrhoids were found during retroflexion.                           Anal papilla(e) were hypertrophied.                           The exam was otherwise without abnormality. Complications:            No immediate complications. Estimated blood loss:                            Minimal. Estimated Blood Loss:     Estimated blood  loss was minimal. Impression:               - Tortuous colon.                           - One 4 mm polyp in the cecum, removed with a cold                            snare. Resected and retrieved.                           - One 5 mm polyp in the ascending colon, removed                            with a cold snare. Resected and retrieved.                           - One 3 mm polyp in the sigmoid colon, removed with  a cold snare. Resected and retrieved.                           - Diverticulosis in the sigmoid colon.                           - Internal hemorrhoids.                           - Anal papilla(e) were hypertrophied.                           - The examination was otherwise normal. Recommendation:           - Patient has a contact number available for                            emergencies. The signs and symptoms of potential                            delayed complications were discussed with the                            patient. Return to normal activities tomorrow.                            Written discharge instructions were provided to the                            patient.                           - Resume previous diet.                           - Continue present medications.                           - Await pathology results.                           - Repeat colonoscopy is recommended for                            surveillance. The colonoscopy date will be                            determined after pathology results from today's                            exam become available for review.                           - No ibuprofen, naproxen, or other non-steroidal                            anti-inflammatory drugs for 2 weeks after polyp  removal. Remo Lipps P. Verona Hartshorn MD, MD 10/22/2017 12:36:50 PM This report has been signed electronically.

## 2017-10-22 NOTE — Progress Notes (Signed)
No problems noted in the recovery room. maw 

## 2017-10-22 NOTE — Progress Notes (Signed)
Called to room to assist during endoscopic procedure.  Patient ID and intended procedure confirmed with present staff. Received instructions for my participation in the procedure from the performing physician.  

## 2017-10-22 NOTE — Progress Notes (Signed)
Pt's states no medical or surgical changes since previsit or office visit. 

## 2017-10-22 NOTE — Patient Instructions (Signed)
YOU HAD AN ENDOSCOPIC PROCEDURE TODAY AT [facility]:   Refer to the procedure report that was given to you for any specific questions about what was found during the examination.  If the procedure report does not answer your questions, please call your gastroenterologist to clarify.  If you requested that your care partner not be given the details of your procedure findings, then the procedure report has been included in a sealed envelope for you to review at your convenience later.  YOU SHOULD EXPECT: Some feelings of bloating in the abdomen. Passage of more gas than usual.  Walking can help get rid of the air that was put into your GI tract during the procedure and reduce the bloating. If you had a lower endoscopy (such as a colonoscopy or flexible sigmoidoscopy) you may notice spotting of blood in your stool or on the toilet paper. If you underwent a bowel prep for your procedure, you may not have a normal bowel movement for a few days.  Please Note:  You might notice some irritation and congestion in your nose or some drainage.  This is from the oxygen used during your procedure.  There is no need for concern and it should clear up in a day or so.  SYMPTOMS TO REPORT IMMEDIATELY:   Following lower endoscopy (colonoscopy or flexible sigmoidoscopy):  Excessive amounts of blood in the stool  Significant tenderness or worsening of abdominal pains  Swelling of the abdomen that is new, acute  Fever of 100F or higher    For urgent or emergent issues, a gastroenterologist can be reached at any hour by calling [phone].   DIET:  We do recommend a small meal at first, but then you may proceed to your regular diet.  Drink plenty of fluids but you should avoid alcoholic beverages for 24 hours.  ACTIVITY:  You should plan to take it easy for the rest of today and you should NOT DRIVE or use heavy machinery until tomorrow (because of the sedation medicines used during the test).     FOLLOW UP: Our staff will call the number listed on your records the next business day following your procedure to check on you and address any questions or concerns that you may have regarding the information given to you following your procedure. If we do not reach you, we will leave a message.  However, if you are feeling well and you are not experiencing any problems, there is no need to return our call.  We will assume that you have returned to your regular daily activities without incident.  If any biopsies were taken you will be contacted by phone or by letter within the next 1-3 weeks.  Please call us at [phone] if you have not heard about the biopsies in 3 weeks.    SIGNATURES/CONFIDENTIALITY: You and/or your care partner have signed paperwork which will be entered into your electronic medical record.  These signatures attest to the fact that that the information above on your After Visit Summary has been reviewed and is understood.  Full responsibility of the confidentiality of this discharge information lies with you and/or your care-partner.    Handouts were given to your care partner on polyps, diverticulosis, and hemorrhoids. NO ASPIRIN, ASPIRIN CONTAINING PRODUCTS (BC OR GOODY POWDERS) OR NSAIDS (IBUPROFEN, ADVIL, ALEVE, AND MOTRIN) FOR 2 weeks; TYLENOL IS OK TO TAKE if needed. You may resume your other current medications today. Await biopsy results. Please call if  any questions or concerns.   

## 2017-10-23 ENCOUNTER — Telehealth: Payer: Self-pay | Admitting: *Deleted

## 2017-10-23 NOTE — Telephone Encounter (Signed)
  Follow up Call-  Call back number 10/22/2017  Post procedure Call Back phone  # [phone]  Permission to leave phone message Yes  Some recent data might be hidden     Patient questions:  Do you have a fever, pain , or abdominal swelling? No. Pain Score  0 *  Have you tolerated food without any problems? Yes.    Have you been able to return to your normal activities? Yes.    Do you have any questions about your discharge instructions: Diet   No. Medications  No. Follow up visit  No.  Do you have questions or concerns about your Care? No.  Actions: * If pain score is 4 or above: No action needed, pain <4.

## 2017-10-29 ENCOUNTER — Encounter: Payer: Self-pay | Admitting: Gastroenterology

## 2017-10-31 ENCOUNTER — Encounter: Payer: Self-pay | Admitting: Family Medicine

## 2017-10-31 DIAGNOSIS — N281 Cyst of kidney, acquired: Secondary | ICD-10-CM | POA: Insufficient documentation

## 2017-10-31 DIAGNOSIS — N23 Unspecified renal colic: Secondary | ICD-10-CM | POA: Insufficient documentation

## 2018-03-07 ENCOUNTER — Encounter: Payer: Self-pay | Admitting: Family Medicine

## 2018-04-04 ENCOUNTER — Ambulatory Visit: Payer: No Typology Code available for payment source | Admitting: Family Medicine

## 2018-04-04 ENCOUNTER — Encounter: Payer: Self-pay | Admitting: Family Medicine

## 2018-04-04 VITALS — BP 130/80 | HR 92 | Temp 98.4°F | Ht 64.0 in | Wt 176.0 lb

## 2018-04-04 DIAGNOSIS — J029 Acute pharyngitis, unspecified: Secondary | ICD-10-CM | POA: Diagnosis not present

## 2018-04-04 LAB — POCT RAPID STREP A (OFFICE): Rapid Strep A Screen: NEGATIVE

## 2018-04-04 NOTE — Patient Instructions (Signed)

## 2018-04-04 NOTE — Progress Notes (Signed)
Subjective:    Patient ID: Maria Aguirre, female    DOB: Sep 28, 1956, 62 y.o.   MRN: [id number]  HPI This is a 62 yo female who presents today with sore throat x 2 days. Started with not feeling well. A little achy, very fatigued. No runny nose, subjective fever. No cough, no headache. Sick contact at work. Possibly some post nasal drainage. No difficulty swallowing food or liquids. Some hoarseness of voice today.   Past Medical History:  Diagnosis Date  . Allergy   . Cerebral aneurysm remote   ?of this - told by neurologist, was told at age 48  . GERD (gastroesophageal reflux disease)    PRN  . History of migraine with aura    "no migraine in a long time"  . Rosacea    dairy flares up rosacea  . Seasonal allergies    Past Surgical History:  Procedure Laterality Date  . APPENDECTOMY  2004  . BUNIONECTOMY Bilateral   . CARPAL TUNNEL RELEASE Bilateral   . COLONOSCOPY  10/2017   TAs, diverticulosis, rpt 3 yrs ([facility])  . TUBAL LIGATION     Family History  Problem Relation Age of Onset  . CAD Father        stents  . Cancer Maternal Grandmother 49       colon  . Colon cancer Maternal Grandmother 60  . CAD Paternal Grandmother   . Heart failure Paternal Grandmother   . Parkinson's disease Maternal Grandfather   . Thyroid disease Mother   . High Cholesterol Mother   . Heart disease Sister 58       pacemaker  . CAD Paternal Uncle        MI  . Diabetes Neg Hx   . Esophageal cancer Neg Hx   . Stomach cancer Neg Hx   . Rectal cancer Neg Hx   . Colon polyps Neg Hx    Social History   Tobacco Use  . Smoking status: Never Smoker  . Smokeless tobacco: Never Used  Substance Use Topics  . Alcohol use: Yes    Comment: occasionally bi-weekly beer per pt  . Drug use: No      Review of Systems Per HPI    Objective:   Physical Exam  Constitutional: She is oriented to person, place, and time. She appears well-developed and well-nourished. No distress.  HENT:    Head: Normocephalic and atraumatic.  Right Ear: Tympanic membrane and ear canal normal.  Left Ear: Tympanic membrane and ear canal normal.  Mouth/Throat: Uvula is midline and mucous membranes are normal. No uvula swelling. Posterior oropharyngeal erythema (mild) present. No oropharyngeal exudate, posterior oropharyngeal edema or tonsillar abscesses.  Neck: Normal range of motion. Neck supple.  Cardiovascular: Normal rate, regular rhythm and normal heart sounds.  Pulmonary/Chest: Effort normal and breath sounds normal.  Lymphadenopathy:    She has no cervical adenopathy.  Neurological: She is alert and oriented to person, place, and time.  Skin: Skin is warm and dry.  Psychiatric: She has a normal mood and affect. Her behavior is normal.      BP 130/80 (BP Location: Right Arm, Patient Position: Sitting, Cuff Size: Normal)   Pulse 92   Temp 98.4 F (36.9 C) (Oral)   Ht 5\' 4"  (1.626 m)   Wt 176 lb (79.8 kg)   SpO2 98%   BMI 30.21 kg/m  Wt Readings from Last 3 Encounters:  04/04/18 176 lb (79.8 kg)  10/22/17 170 lb (77.1 kg)  10/15/17 170 lb (77.1 kg)       Results for orders placed or performed in visit on 04/04/18  POCT rapid strep A  Result Value Ref Range   Rapid Strep A Screen Negative Negative    Assessment & Plan:  1. Sore throat - likely viral - Provided written and verbal information regarding diagnosis and treatment. - discussed symptomatic treatment measures, RTC/ER precautions - POCT rapid strep A   Clarene Reamer, FNP-BC  [facility]  04/05/2018 1:08 PM

## 2018-04-05 ENCOUNTER — Encounter: Payer: Self-pay | Admitting: Family Medicine

## 2018-05-30 ENCOUNTER — Encounter (INDEPENDENT_AMBULATORY_CARE_PROVIDER_SITE_OTHER): Payer: Self-pay | Admitting: Family Medicine

## 2018-05-30 ENCOUNTER — Ambulatory Visit (INDEPENDENT_AMBULATORY_CARE_PROVIDER_SITE_OTHER): Payer: Self-pay

## 2018-05-30 ENCOUNTER — Ambulatory Visit (INDEPENDENT_AMBULATORY_CARE_PROVIDER_SITE_OTHER): Payer: No Typology Code available for payment source | Admitting: Family Medicine

## 2018-05-30 DIAGNOSIS — M25561 Pain in right knee: Secondary | ICD-10-CM | POA: Diagnosis not present

## 2018-05-30 NOTE — Progress Notes (Signed)
Office Visit Note   Patient: Maria Aguirre           Date of Birth: [birth year]           MRN: [id number] Visit Date: 05/30/2018 Requested by: Ria Bush, MD [place], [place] [address] PCP: Ria Bush, MD  Subjective: Chief Complaint  Patient presents with  . Right Knee - Pain    Pain x 3 months, worsening. No known injury.    HPI: She is here with right knee pain.  Symptoms started about 3 months ago with no injury.  Intermittent pain on the medial aspect when standing up and putting weight on her leg.  No locking or giving way.  Pain is not severe but she is concerned because her brother had a knee replacement.  She is also going to [employer] in about a month.              ROS: Other systems were negative.  Objective: Vital Signs: There were no vitals taken for this visit.  Physical Exam:  Right knee: 1+ patellofemoral crepitus but negative patella compression test.  1+ effusion with no erythema or warmth.  Solid Lachman's, no laxity with varus/valgus stress.  Moderately tender on the medial joint line but no palpable click with McMurray's.  Imaging: Mild to moderate patellofemoral spurring but overall good joint spacing, no obvious loose body.  2 view x-rays right knee.  Assessment & Plan: 1.  Right knee pain, possibly medial meniscus injury -Ibuprofen, ice after activities.  Cortisone injection prior to her trip if symptoms worsen.  MRI if fails conservative management.   Follow-Up Instructions: No follow-ups on file.     Procedures: None today.   PMFS History: Patient Active Problem List   Diagnosis Date Noted  . Acquired renal cyst of right kidney 09/03/2017  . Rosacea 09/03/2017  . Health maintenance examination 01/10/2015  . Cerebral aneurysm   . Anxiety state 01/24/2009  . CARPAL TUNNEL SYNDROME, BILATERAL 01/24/2009  . Vitamin D deficiency 07/05/2008  . HYPERCHOLESTEROLEMIA 07/05/2008  . GERD 07/05/2008  . OVARIAN CYST  3CM RIGHT 07/05/2008   Past Medical History:  Diagnosis Date  . Allergy   . Cerebral aneurysm remote   ?of this - told by neurologist, was told at age 62  . GERD (gastroesophageal reflux disease)    PRN  . History of migraine with aura    "no migraine in a long time"  . Rosacea    dairy flares up rosacea  . Seasonal allergies     Family History  Problem Relation Age of Onset  . CAD Father        stents  . Cancer Maternal Grandmother 43       colon  . Colon cancer Maternal Grandmother 27  . CAD Paternal Grandmother   . Heart failure Paternal Grandmother   . Parkinson's disease Maternal Grandfather   . Thyroid disease Mother   . High Cholesterol Mother   . Heart disease Sister 64       pacemaker  . CAD Paternal Uncle        MI  . Diabetes Neg Hx   . Esophageal cancer Neg Hx   . Stomach cancer Neg Hx   . Rectal cancer Neg Hx   . Colon polyps Neg Hx     Past Surgical History:  Procedure Laterality Date  . APPENDECTOMY  2004  . BUNIONECTOMY Bilateral   . CARPAL TUNNEL RELEASE Bilateral   .  COLONOSCOPY  10/2017   TAs, diverticulosis, rpt 3 yrs ([facility])  . TUBAL LIGATION     Social History   Occupational History  . Not on file  Tobacco Use  . Smoking status: Never Smoker  . Smokeless tobacco: Never Used  Substance and Sexual Activity  . Alcohol use: Yes    Comment: occasionally bi-weekly beer per pt  . Drug use: No  . Sexual activity: Yes    Birth control/protection: None

## 2019-11-15 ENCOUNTER — Other Ambulatory Visit: Payer: Self-pay | Admitting: Family Medicine

## 2019-11-15 DIAGNOSIS — E7800 Pure hypercholesterolemia, unspecified: Secondary | ICD-10-CM

## 2019-11-15 DIAGNOSIS — Z1159 Encounter for screening for other viral diseases: Secondary | ICD-10-CM

## 2019-11-15 DIAGNOSIS — E559 Vitamin D deficiency, unspecified: Secondary | ICD-10-CM

## 2019-11-17 ENCOUNTER — Other Ambulatory Visit: Payer: Self-pay

## 2019-11-17 ENCOUNTER — Other Ambulatory Visit (INDEPENDENT_AMBULATORY_CARE_PROVIDER_SITE_OTHER): Payer: Medicare Other

## 2019-11-17 DIAGNOSIS — Z1159 Encounter for screening for other viral diseases: Secondary | ICD-10-CM

## 2019-11-17 DIAGNOSIS — E7800 Pure hypercholesterolemia, unspecified: Secondary | ICD-10-CM

## 2019-11-17 DIAGNOSIS — E559 Vitamin D deficiency, unspecified: Secondary | ICD-10-CM

## 2019-11-17 LAB — COMPREHENSIVE METABOLIC PANEL
ALT: 12 U/L (ref 0–35)
AST: 14 U/L (ref 0–37)
Albumin: 3.9 g/dL (ref 3.5–5.2)
Alkaline Phosphatase: 69 U/L (ref 39–117)
BUN: 17 mg/dL (ref 6–23)
CO2: 30 mEq/L (ref 19–32)
Calcium: 9.5 mg/dL (ref 8.4–10.5)
Chloride: 104 mEq/L (ref 96–112)
Creatinine, Ser: 0.82 mg/dL (ref 0.40–1.20)
GFR: 69.94 mL/min (ref >60.00)
Glucose, Bld: 91 mg/dL (ref 70–99)
Potassium: 3.9 mEq/L (ref 3.5–5.1)
Sodium: 139 mEq/L (ref 135–145)
Total Bilirubin: 0.6 mg/dL (ref 0.2–1.2)
Total Protein: 6.8 g/dL (ref 6.0–8.3)

## 2019-11-17 LAB — LIPID PANEL
Cholesterol: 204 mg/dL — ABNORMAL HIGH (ref 0–200)
HDL: 50.7 mg/dL (ref >39.00)
LDL Cholesterol: 134 mg/dL — ABNORMAL HIGH (ref 0–99)
NonHDL: 153.42
Total CHOL/HDL Ratio: 4
Triglycerides: 96 mg/dL (ref 0.0–149.0)
VLDL: 19.2 mg/dL (ref 0.0–40.0)

## 2019-11-17 LAB — VITAMIN D 25 HYDROXY (VIT D DEFICIENCY, FRACTURES): VITD: 30.01 ng/mL (ref 30.00–100.00)

## 2019-11-18 LAB — HEPATITIS C ANTIBODY
Hepatitis C Ab: NONREACTIVE
SIGNAL TO CUT-OFF: 0.01 (ref <1.00)

## 2019-11-23 ENCOUNTER — Encounter: Payer: No Typology Code available for payment source | Admitting: Family Medicine

## 2019-12-04 ENCOUNTER — Ambulatory Visit: Payer: No Typology Code available for payment source

## 2019-12-04 ENCOUNTER — Ambulatory Visit: Payer: Medicare Other | Attending: Internal Medicine

## 2019-12-04 DIAGNOSIS — Z23 Encounter for immunization: Secondary | ICD-10-CM | POA: Insufficient documentation

## 2019-12-04 NOTE — Progress Notes (Signed)
   Covid-19 Vaccination Clinic  Name:  Maria Aguirre    MRN: [id number] DOB: 03-Feb-1956  12/04/2019  [name] was observed post Covid-19 immunization for 15 minutes without incidence. She was provided with Vaccine Information Sheet and instruction to access the V-Safe system.   [name] was instructed to call 911 with any severe reactions post vaccine: Marland Kitchen Difficulty breathing  . Swelling of your face and throat  . A fast heartbeat  . A bad rash all over your body  . Dizziness and weakness    Immunizations Administered    Name Date Dose VIS Date Route   Pfizer COVID-19 Vaccine 12/04/2019  8:18 AM 0.3 mL 09/18/2019 Intramuscular   Manufacturer: [place]   Lot: [id number]   [place]: [id number]

## 2019-12-08 ENCOUNTER — Encounter: Payer: Self-pay | Admitting: Family Medicine

## 2019-12-08 ENCOUNTER — Ambulatory Visit (INDEPENDENT_AMBULATORY_CARE_PROVIDER_SITE_OTHER): Payer: Medicare Other | Admitting: Family Medicine

## 2019-12-08 ENCOUNTER — Other Ambulatory Visit: Payer: Self-pay

## 2019-12-08 VITALS — BP 130/80 | HR 79 | Temp 97.9°F | Ht 63.25 in | Wt 181.0 lb

## 2019-12-08 DIAGNOSIS — E559 Vitamin D deficiency, unspecified: Secondary | ICD-10-CM

## 2019-12-08 DIAGNOSIS — Z7189 Other specified counseling: Secondary | ICD-10-CM | POA: Diagnosis not present

## 2019-12-08 DIAGNOSIS — K219 Gastro-esophageal reflux disease without esophagitis: Secondary | ICD-10-CM

## 2019-12-08 DIAGNOSIS — Z1231 Encounter for screening mammogram for malignant neoplasm of breast: Secondary | ICD-10-CM

## 2019-12-08 DIAGNOSIS — E2839 Other primary ovarian failure: Secondary | ICD-10-CM

## 2019-12-08 DIAGNOSIS — N281 Cyst of kidney, acquired: Secondary | ICD-10-CM

## 2019-12-08 DIAGNOSIS — E7800 Pure hypercholesterolemia, unspecified: Secondary | ICD-10-CM

## 2019-12-08 DIAGNOSIS — Z0000 Encounter for general adult medical examination without abnormal findings: Secondary | ICD-10-CM | POA: Insufficient documentation

## 2019-12-08 DIAGNOSIS — R590 Localized enlarged lymph nodes: Secondary | ICD-10-CM | POA: Insufficient documentation

## 2019-12-08 DIAGNOSIS — K056 Periodontal disease, unspecified: Secondary | ICD-10-CM

## 2019-12-08 NOTE — Assessment & Plan Note (Signed)

## 2019-12-08 NOTE — Assessment & Plan Note (Addendum)
Advanced directive planning - daughter RN would be HCPOA. Ok with CPR but would not want prolonged life support if terminal condition.

## 2019-12-08 NOTE — Assessment & Plan Note (Signed)
Managed with daily pepcid

## 2019-12-08 NOTE — Patient Instructions (Addendum)
I will place referral for detailed mammogram and possible ultrasound (and hopefully bone density scan on the same day) to the [facility] breast center.  Complete Covid-19 series then wait 45 days and get shingrix and Tdap.  We will order kidney ultrasound.  Further labwork today.  Good to see you today. Call us with questions. Return as needed or in 1 year for next physical.   Health Maintenance After Age 66 After age 31, you are at a higher risk for certain long-term diseases and infections as well as injuries from falls. Falls are a major cause of broken bones and head injuries in people who are older than age 48. Getting regular preventive care can help to keep you healthy and well. Preventive care includes getting regular testing and making lifestyle changes as recommended by your health care provider. Talk with your health care provider about:  Which screenings and tests you should have. A screening is a test that checks for a disease when you have no symptoms.  A diet and exercise plan that is right for you. What should I know about screenings and tests to prevent falls? Screening and testing are the best ways to find a health problem early. Early diagnosis and treatment give you the best chance of managing medical conditions that are common after age 47. Certain conditions and lifestyle choices may make you more likely to have a fall. Your health care provider may recommend:  Regular vision checks. Poor vision and conditions such as cataracts can make you more likely to have a fall. If you wear glasses, make sure to get your prescription updated if your vision changes.  Medicine review. Work with your health care provider to regularly review all of the medicines you are taking, including over-the-counter medicines. Ask your health care provider about any side effects that may make you more likely to have a fall. Tell your health care provider if any medicines that you take make you feel  dizzy or sleepy.  Osteoporosis screening. Osteoporosis is a condition that causes the bones to get weaker. This can make the bones weak and cause them to break more easily.  Blood pressure screening. Blood pressure changes and medicines to control blood pressure can make you feel dizzy.  Strength and balance checks. Your health care provider may recommend certain tests to check your strength and balance while standing, walking, or changing positions.  Foot health exam. Foot pain and numbness, as well as not wearing proper footwear, can make you more likely to have a fall.  Depression screening. You may be more likely to have a fall if you have a fear of falling, feel emotionally low, or feel unable to do activities that you used to do.  Alcohol use screening. Using too much alcohol can affect your balance and may make you more likely to have a fall. What actions can I take to lower my risk of falls? General instructions  Talk with your health care provider about your risks for falling. Tell your health care provider if: ? You fall. Be sure to tell your health care provider about all falls, even ones that seem minor. ? You feel dizzy, sleepy, or off-balance.  Take over-the-counter and prescription medicines only as told by your health care provider. These include any supplements.  Eat a healthy diet and maintain a healthy weight. A healthy diet includes low-fat dairy products, low-fat (lean) meats, and fiber from whole grains, beans, and lots of fruits and vegetables. Home safety  Remove any tripping hazards, such as rugs, cords, and clutter.  Install safety equipment such as grab bars in bathrooms and safety rails on stairs.  Keep rooms and walkways well-lit. Activity   Follow a regular exercise program to stay fit. This will help you maintain your balance. Ask your health care provider what types of exercise are appropriate for you.  If you need a cane or walker, use it as  recommended by your health care provider.  Wear supportive shoes that have nonskid soles. Lifestyle  Do not drink alcohol if your health care provider tells you not to drink.  If you drink alcohol, limit how much you have: ? 0-1 drink a day for women. ? 0-2 drinks a day for men.  Be aware of how much alcohol is in your drink. In the [place] one drink equals one typical bottle of beer (12 oz), one-half glass of wine (5 oz), or one shot of hard liquor (1 oz).  Do not use any products that contain nicotine or tobacco, such as cigarettes and e-cigarettes. If you need help quitting, ask your health care provider. Summary  Having a healthy lifestyle and getting preventive care can help to protect your health and wellness after age 8.  Screening and testing are the best way to find a health problem early and help you avoid having a fall. Early diagnosis and treatment give you the best chance for managing medical conditions that are more common for people who are older than age 15.  Falls are a major cause of broken bones and head injuries in people who are older than age 79. Take precautions to prevent a fall at home.  Work with your health care provider to learn what changes you can make to improve your health and wellness and to prevent falls. This information is not intended to replace advice given to you by your health care provider. Make sure you discuss any questions you have with your health care provider. Document Revised: 01/15/2019 Document Reviewed: 08/07/2017 Elsevier Patient Education  2020 [employer].

## 2019-12-08 NOTE — Progress Notes (Addendum)
This visit was conducted in person.  BP 130/80 (BP Location: Left Arm, Patient Position: Sitting, Cuff Size: Normal)   Pulse 79   Temp 97.9 F (36.6 C) (Temporal)   Ht 5' 3.25" (1.607 m)   Wt 181 lb (82.1 kg)   SpO2 97%   BMI 31.81 kg/m    CC: welcome to medicare visit Subjective:    Patient ID: Maria Aguirre, female    DOB: [birth year], 64 y.o.   MRN: [id number]  HPI: Maria Aguirre is a 64 y.o. female presenting on 12/08/2019 for Welcome to Medicare Exam    Hearing Screening   125Hz  250Hz  500Hz  1000Hz  2000Hz  3000Hz  4000Hz  6000Hz  8000Hz   Right ear:   20 25 20   40    Left ear:   20 40 25  40      Visual Acuity Screening   Right eye Left eye Both eyes  Without correction:     With correction: 20/20 20/30 20/20       Office Visit from 12/08/2019 in [place] at [facility]  PHQ-2 Total Score  0      Fall Risk  12/08/2019  Falls in the past year? 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  Periodontal issues - sees Dr LMckinley JewelQ4 months  Worsening acid reflux managed with pepcid at night.   Preventative: COLONOSCOPY 10/2017 - TAs, diverticulosis, rpt 3 yrs ([facility]) Breast cancer screening -mammo normal 03/2016. Breast exams at home. Well woman exam - prior saw Dr TRonita Hipps Normal pap smears in the past -latest 2016. No pelvic pain, vaginal bleeding. DEXA scan 2009 - spine -1.3 Flu shot yearly Tetanus shot - 05/2009, will need Tdap prior to 03/2020 (new grandchild expected) [place] - discussed  Advanced directive planning - daughter RN would be HCPOA. Ok with CPR but would not want prolonged life support if terminal condition. Seat belt use discussed Sunscreen use discussed - no changing moles on skin.  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 Activity: walking at work, [employer] Diet: good water, fruits/vegetables daily     Relevant past medical, surgical, family and social history reviewed and updated as indicated. Interim medical history since our last visit reviewed. Allergies and medications reviewed and updated. Outpatient Medications Prior to Visit  Medication Sig Dispense Refill  . Ascorbic Acid (SUPER C COMPLEX PO) Take by mouth daily.    . ASPIRIN 81 PO Take 1 tablet by mouth daily.    . B COMPLEX VITAMINS PO Take by mouth.    . Calcium Carbonate 260 MG CHEW Chew 2 tablets by mouth 2 (two) times daily.    . cholecalciferol (VITAMIN D) 1000 UNITS tablet Take 1,000 Units by mouth 2 (two) times daily.    . diphenhydrAMINE (BENADRYL) 25 mg capsule Take 25 mg by mouth at bedtime. As needed    . diphenhydramine-acetaminophen (TYLENOL PM) 25-500 MG TABS tablet Take 1 tablet by mouth at bedtime as needed.    . famotidine (PEPCID) 20 MG tablet Take 20 mg by mouth daily.    .Marland Kitchen  vitamin C (ASCORBIC ACID) 500 MG tablet Take 500 mg by mouth 2 (two) times daily.    . metroNIDAZOLE (METROCREAM) 0.75 % cream Apply 1 application topically daily.    Marland Kitchen 0.9 %  sodium chloride infusion      No facility-administered medications prior to visit.     Per HPI unless specifically indicated in ROS section below Review of Systems Objective:    BP 130/80 (BP Location: Left Arm, Patient Position: Sitting, Cuff Size: Normal)   Pulse 79   Temp 97.9 F (36.6 C) (Temporal)   Ht 5' 3.25" (1.607 m)   Wt 181 lb (82.1 kg)   SpO2 97%   BMI 31.81 kg/m   Wt Readings from Last 3 Encounters:  12/08/19 181 lb (82.1 kg)  04/04/18 176 lb (79.8 kg)  10/22/17 170 lb (77.1 kg)    Physical Exam Vitals and nursing note reviewed.    Constitutional:      General: She is not in acute distress.    Appearance: Normal appearance. She is well-developed. She is not ill-appearing.  HENT:     Head: Normocephalic and atraumatic.     Right Ear: Hearing, tympanic membrane, ear canal and external ear normal.     Left Ear: Hearing, tympanic membrane, ear canal and external ear normal.     Mouth/Throat:     Pharynx: Uvula midline.  Eyes:     General: No scleral icterus.    Extraocular Movements: Extraocular movements intact.     Conjunctiva/sclera: Conjunctivae normal.     Pupils: Pupils are equal, round, and reactive to light.  Neck:     Thyroid: No thyromegaly or thyroid tenderness.  Cardiovascular:     Rate and Rhythm: Normal rate and regular rhythm.     Pulses: Normal pulses.          Radial pulses are 2+ on the right side and 2+ on the left side.     Heart sounds: Normal heart sounds. No murmur.  Pulmonary:     Effort: Pulmonary effort is normal. No respiratory distress.     Breath sounds: Normal breath sounds. No wheezing, rhonchi or rales.  Chest:     Breasts: Breasts are symmetrical.        Right: Normal. No swelling, bleeding, inverted nipple, mass, nipple discharge, skin change or tenderness.        Left: Normal. No swelling, bleeding, inverted nipple, mass, nipple discharge, skin change or tenderness.  Abdominal:     General: Abdomen is flat. Bowel sounds are normal. There is no distension.     Palpations: Abdomen is soft. There is no mass.     Tenderness: There is no abdominal tenderness. There is no guarding or rebound.     Hernia: No hernia is present.  Musculoskeletal:        General: Normal range of motion.     Cervical back: Normal range of motion and neck supple.     Right lower leg: No edema.     Left lower leg: No edema.  Lymphadenopathy:     Head:     Right side of head: Submandibular adenopathy present. No submental, tonsillar, preauricular or posterior auricular adenopathy.     Left side of  head: Submandibular adenopathy present. No submental, tonsillar, preauricular or posterior auricular adenopathy.     Cervical: No cervical adenopathy.     Upper Body:     Right upper body: No supraclavicular or axillary adenopathy.     Left upper body: Supraclavicular  adenopathy present. No axillary adenopathy.     Lower Body: No right inguinal adenopathy. No left inguinal adenopathy.  Skin:    General: Skin is warm and dry.     Findings: No rash.  Neurological:     General: No focal deficit present.     Mental Status: She is alert and oriented to person, place, and time.     Comments:  CN grossly intact, station and gait intact Recall 3/3 Calculation 5/5 DLROW  Psychiatric:        Mood and Affect: Mood normal.        Behavior: Behavior normal.        Thought Content: Thought content normal.        Judgment: Judgment normal.       Results for orders placed or performed in visit on 12/08/19  Sedimentation rate  Result Value Ref Range   Sed Rate 7 0 - 30 mm/hr  CBC with Differential/Platelet  Result Value Ref Range   WBC 7.8 4.0 - 10.5 K/uL   RBC 4.68 3.87 - 5.11 Mil/uL   Hemoglobin 13.5 12.0 - 15.0 g/dL   HCT 39.8 36.0 - 46.0 %   MCV 85.0 78.0 - 100.0 fl   MCHC 33.8 30.0 - 36.0 g/dL   RDW 13.9 11.5 - 15.5 %   Platelets 228.0 150.0 - 400.0 K/uL   Neutrophils Relative % 57.8 43.0 - 77.0 %   Lymphocytes Relative 29.9 12.0 - 46.0 %   Monocytes Relative 9.8 3.0 - 12.0 %   Eosinophils Relative 1.5 0.0 - 5.0 %   Basophils Relative 1.0 0.0 - 3.0 %   Neutro Abs 4.5 1.4 - 7.7 K/uL   Lymphs Abs 2.3 0.7 - 4.0 K/uL   Monocytes Absolute 0.8 0.1 - 1.0 K/uL   Eosinophils Absolute 0.1 0.0 - 0.7 K/uL   Basophils Absolute 0.1 0.0 - 0.1 K/uL   EKG - NSR rate 75, normal axis, intervals, no acute ST/T changes Assessment & Plan:  This visit occurred during the SARS-CoV-2 public health emergency.  Safety protocols were in place, including screening questions prior to the visit, additional  usage of staff PPE, and extensive cleaning of exam room while observing appropriate contact time as indicated for disinfecting solutions.   Problem List Items Addressed This Visit    Welcome to Medicare preventive visit - Primary    I have personally reviewed the Medicare Annual Wellness questionnaire and have noted 1. The patient's medical and social history 2. Their use of alcohol, tobacco or illicit drugs 3. Their current medications and supplements 4. The patient's functional ability including ADL's, fall risks, home safety risks and hearing or visual impairment. Cognitive function has been assessed and addressed as indicated.  5. Diet and physical activity 6. Evidence for depression or mood disorders The patients weight, height, BMI have been recorded in the chart. I have made referrals, counseling and provided education to the patient based on review of the above and I have provided the pt with a written personalized care plan for preventive services. Provider list updated.. See scanned questionairre as needed for further documentation. Reviewed preventative protocols and updated unless pt declined.       Relevant Orders   EKG 12-Lead (Completed)   Vitamin D deficiency    Doing well on oral 2000 IU replacement daily.      Periodontal disease    Regularly sees dentist.      Lymphadenopathy, supraclavicular    Incidentally noted today along with  bilateral submandibular LAD - check diagnostic mammo/US and CBC/ESR. If normal, consider neck US for further evaluation.   ==>ADDENDUM - in setting of recent covid vaccine, possible cause of palpable lymph nodes - will defer for 4-6 wks and then check screening mammogram in place of diagnostic. Message sent to patient through mychart.       Relevant Orders   Sedimentation rate (Completed)   CBC with Differential/Platelet (Completed)   HYPERCHOLESTEROLEMIA    Mild, stable off medication. Reviewed dietary choices to improve LDL  cholesterol.  The 10-year ASCVD risk score Mikey Bussing DC Brooke Bonito., et al., 2013) is: 6%   Values used to calculate the score:     Age: 41 years     Sex: Female     Is Non-Hispanic African American: No     Diabetic: No     Tobacco smoker: No     Systolic Blood Pressure: 086 mmHg     Is BP treated: No     HDL Cholesterol: 50.7 mg/dL     Total Cholesterol: 204 mg/dL       Relevant Medications   ASPIRIN 81 PO   GERD    Managed with daily pepcid      Relevant Medications   famotidine (PEPCID) 20 MG tablet   Advanced care planning/counseling discussion    Advanced directive planning - daughter RN would be HCPOA. Ok with CPR but would not want prolonged life support if terminal condition.      Acquired renal cyst of right kidney    Update renal US - overdue. Patient asymptomatic.       Relevant Orders   US Renal    Other Visit Diagnoses    Estrogen deficiency       Relevant Orders   DG Bone Density   Encounter for screening mammogram for malignant neoplasm of breast       Relevant Orders   MM 3D SCREEN BREAST BILATERAL       No orders of the defined types were placed in this encounter.  Orders Placed This Encounter  Procedures  . US Renal    Wt.181 Ins. uhc No needs F[facility] w Mirian No to covid q's     Standing Status:   Future    Standing Expiration Date:   02/06/2021    Order Specific Question:   Reason for Exam (SYMPTOM  OR DIAGNOSIS REQUIRED)    Answer:   f/u R kidney cyst    Order Specific Question:   Preferred imaging location?    Answer:   [facility]  . DG Bone Density    Medicare Pf: can't remember Wt:181 Pt advised to sotp cali- multi vitms 48 hrs priori    Standing Status:   Future    Standing Expiration Date:   02/06/2021    Order Specific Question:   Reason for Exam (SYMPTOM  OR DIAGNOSIS REQUIRED)    Answer:   osteoporosis screening    Order Specific Question:   Preferred imaging location?    Answer:   [facility]  . MM 3D SCREEN BREAST  BILATERAL    Standing Status:   Future    Standing Expiration Date:   02/09/2021    Order Specific Question:   Reason for Exam (SYMPTOM  OR DIAGNOSIS REQUIRED)    Answer:   screening mammo    Order Specific Question:   Preferred imaging location?    Answer:   [facility]  . Sedimentation rate  . CBC with Differential/Platelet  .  EKG 12-Lead    Patient instructions: I will place referral for detailed mammogram and possible ultrasound (and hopefully bone density scan on the same day) to the [facility].  Complete Covid-19 series then wait 45 days and get shingrix and Tdap.  We will order kidney ultrasound.  Further labwork today.  Good to see you today. Call us with questions. Return as needed or in 1 year for next physical.   Follow up plan: Return in about 1 year (around 12/07/2020) for medicare wellness visit.  Ria Bush, MD

## 2019-12-08 NOTE — Assessment & Plan Note (Signed)
Regularly sees dentist.

## 2019-12-08 NOTE — Assessment & Plan Note (Signed)
Mild, stable off medication. Reviewed dietary choices to improve LDL cholesterol.  The 10-year ASCVD risk score Mikey Bussing DC Brooke Bonito., et al., 2013) is: 6%   Values used to calculate the score:     Age: 64 years     Sex: Female     Is Non-Hispanic African American: No     Diabetic: No     Tobacco smoker: No     Systolic Blood Pressure: [id number]     Is BP treated: No     HDL Cholesterol: 50.7 mg/dL     Total Cholesterol: 204 mg/dL

## 2019-12-08 NOTE — Assessment & Plan Note (Addendum)
Update renal US - overdue. Patient asymptomatic.

## 2019-12-08 NOTE — Assessment & Plan Note (Addendum)
Incidentally noted today along with bilateral submandibular LAD - check diagnostic mammo/US and CBC/ESR. If normal, consider neck US for further evaluation.   ==>ADDENDUM - in setting of recent covid vaccine, possible cause of palpable lymph nodes - will defer for 4-6 wks and then check screening mammogram in place of diagnostic. Message sent to patient through mychart.

## 2019-12-08 NOTE — Assessment & Plan Note (Signed)
Doing well on oral 2000 IU replacement daily.

## 2019-12-09 LAB — CBC WITH DIFFERENTIAL/PLATELET
Basophils Absolute: 0.1 10*3/uL (ref 0.0–0.1)
Basophils Relative: 1 % (ref 0.0–3.0)
Eosinophils Absolute: 0.1 10*3/uL (ref 0.0–0.7)
Eosinophils Relative: 1.5 % (ref 0.0–5.0)
HCT: 39.8 % (ref 36.0–46.0)
Hemoglobin: 13.5 g/dL (ref 12.0–15.0)
Lymphocytes Relative: 29.9 % (ref 12.0–46.0)
Lymphs Abs: 2.3 10*3/uL (ref 0.7–4.0)
MCHC: 33.8 g/dL (ref 30.0–36.0)
MCV: 85 fl (ref 78.0–100.0)
Monocytes Absolute: 0.8 10*3/uL (ref 0.1–1.0)
Monocytes Relative: 9.8 % (ref 3.0–12.0)
Neutro Abs: 4.5 10*3/uL (ref 1.4–7.7)
Neutrophils Relative %: 57.8 % (ref 43.0–77.0)
Platelets: 228 10*3/uL (ref 150.0–400.0)
RBC: 4.68 Mil/uL (ref 3.87–5.11)
RDW: 13.9 % (ref 11.5–15.5)
WBC: 7.8 10*3/uL (ref 4.0–10.5)

## 2019-12-09 LAB — SEDIMENTATION RATE: Sed Rate: 7 mm/hr (ref 0–30)

## 2019-12-11 NOTE — Addendum Note (Signed)
Addended by: Ria Bush on: 12/11/2019 05:51 PM   Modules accepted: Orders

## 2019-12-14 ENCOUNTER — Ambulatory Visit
Admission: RE | Admit: 2019-12-14 | Discharge: 2019-12-14 | Disposition: A | Discharge status: Home / Self Care | Payer: Medicare Other | Source: Ambulatory Visit | Attending: Family Medicine | Admitting: Family Medicine

## 2019-12-14 DIAGNOSIS — N281 Cyst of kidney, acquired: Secondary | ICD-10-CM

## 2019-12-22 ENCOUNTER — Other Ambulatory Visit: Payer: No Typology Code available for payment source

## 2019-12-29 ENCOUNTER — Ambulatory Visit: Payer: Medicare Other | Attending: Internal Medicine

## 2019-12-29 DIAGNOSIS — Z23 Encounter for immunization: Secondary | ICD-10-CM

## 2019-12-29 NOTE — Progress Notes (Signed)
   Covid-19 Vaccination Clinic  Name:  Maria Aguirre    MRN: [id number] DOB: 1956-04-08  12/29/2019  [name] was observed post Covid-19 immunization for 15 minutes without incident. She was provided with Vaccine Information Sheet and instruction to access the V-Safe system.   [name] was instructed to call 911 with any severe reactions post vaccine: Marland Kitchen Difficulty breathing  . Swelling of face and throat  . A fast heartbeat  . A bad rash all over body  . Dizziness and weakness   Immunizations Administered    Name Date Dose VIS Date Route   Pfizer COVID-19 Vaccine 12/29/2019  9:36 AM 0.3 mL 09/18/2019 Intramuscular   Manufacturer: [place]   Lot: [id number]   [place]: [id number]

## 2020-01-28 ENCOUNTER — Ambulatory Visit (INDEPENDENT_AMBULATORY_CARE_PROVIDER_SITE_OTHER): Payer: Medicare Other | Admitting: Family Medicine

## 2020-01-28 ENCOUNTER — Encounter: Payer: Self-pay | Admitting: Family Medicine

## 2020-01-28 ENCOUNTER — Other Ambulatory Visit: Payer: Self-pay

## 2020-01-28 DIAGNOSIS — N281 Cyst of kidney, acquired: Secondary | ICD-10-CM

## 2020-01-28 DIAGNOSIS — R590 Localized enlarged lymph nodes: Secondary | ICD-10-CM | POA: Diagnosis not present

## 2020-01-28 DIAGNOSIS — G43109 Migraine with aura, not intractable, without status migrainosus: Secondary | ICD-10-CM | POA: Diagnosis not present

## 2020-01-28 DIAGNOSIS — I671 Cerebral aneurysm, nonruptured: Secondary | ICD-10-CM | POA: Diagnosis not present

## 2020-01-28 HISTORY — DX: Migraine with aura, not intractable, without status migrainosus: G43.109

## 2020-01-28 NOTE — Assessment & Plan Note (Signed)
This has largely resolved - anticipate LAD due to COVID vaccine. She has upcoming mammogram scheduled next week.

## 2020-01-28 NOTE — Patient Instructions (Addendum)
Lymph nodes have largely resolved.  Ok to proceed with mammogram when you return from [place] your upcoming trip!  We will get MRA schedule to further evaluate possible aneurysm.

## 2020-01-28 NOTE — Assessment & Plan Note (Signed)
Rare migraines since teenager.

## 2020-01-28 NOTE — Progress Notes (Signed)
This visit was conducted in person.  BP 136/80 (BP Location: Right Arm, Cuff Size: Normal)   Pulse 69   Temp 97.7 F (36.5 C) (Temporal)   Ht 5' 3.25" (1.607 m)   Wt 181 lb (82.1 kg)   SpO2 98%   BMI 31.81 kg/m    CC: f/u LN eval Subjective:    Patient ID: Maria Aguirre, female    DOB: [birth year], 64 y.o.   MRN: [id number]  HPI: Maria Aguirre is a 64 y.o. female presenting on 01/28/2020 for Follow-up (Here for lymph node check.)   See prior note for details.  Seen here last month for welcome to medicare physical, incidentally noted enlarged lymph nodes bilateral submandibular region and L supraclavicular region - in setting of recent COVID. She had normal CBC/ESR.   PFIZER SARS-COV-2 Vaccination: 12/29/2019, 12/04/2019  Complex 19m Bosinak IIF R renal cyst: [place] - 173mexophytic cyst upper pole R kidney compatible with Bosniak I lesion   H/o migraine with preceding aura since age 6614sVery rare.  She applied for LTC insurance - denied due to h/o aneurysm.  States she was told by neurologist in the 70's that she had this. No fmhx cerebral aneurysm.   Mother EdHumberto Sealss doing better after recent abx.  Mother just completed hip replacement surgery - she has been her caregiver during this time (and prior). Has been stressful period for patient.      Relevant past medical, surgical, family and social history reviewed and updated as indicated. Interim medical history since our last visit reviewed. Allergies and medications reviewed and updated. Outpatient Medications Prior to Visit  Medication Sig Dispense Refill  . ASPIRIN 81 PO Take 1 tablet by mouth daily.    . B COMPLEX VITAMINS PO Take by mouth.    . Calcium Carbonate 260 MG CHEW Chew 2 tablets by mouth 2 (two) times daily.    . cholecalciferol (VITAMIN D) 1000 UNITS tablet Take 1,000 Units by mouth 2 (two) times daily.    . diphenhydrAMINE (BENADRYL) 25 mg capsule Take 25 mg by mouth at bedtime. As  needed    . diphenhydramine-acetaminophen (TYLENOL PM) 25-500 MG TABS tablet Take 1 tablet by mouth at bedtime as needed.    . famotidine (PEPCID) 20 MG tablet Take 20 mg by mouth daily.    . vitamin C (ASCORBIC ACID) 500 MG tablet Take 500 mg by mouth 2 (two) times daily.    . Ascorbic Acid (SUPER C COMPLEX PO) Take by mouth daily.     No facility-administered medications prior to visit.     Per HPI unless specifically indicated in ROS section below Review of Systems Objective:    BP 136/80 (BP Location: Right Arm, Cuff Size: Normal)   Pulse 69   Temp 97.7 F (36.5 C) (Temporal)   Ht 5' 3.25" (1.607 m)   Wt 181 lb (82.1 kg)   SpO2 98%   BMI 31.81 kg/m   Wt Readings from Last 3 Encounters:  01/28/20 181 lb (82.1 kg)  12/08/19 181 lb (82.1 kg)  04/04/18 176 lb (79.8 kg)    Physical Exam Vitals and nursing note reviewed.  Constitutional:      Appearance: Normal appearance. She is not ill-appearing.  HENT:     Head: Normocephalic and atraumatic.     Mouth/Throat:     Mouth: Mucous membranes are moist.     Pharynx: Oropharynx is clear. No oropharyngeal exudate or posterior oropharyngeal  erythema.  Eyes:     Extraocular Movements: Extraocular movements intact.     Pupils: Pupils are equal, round, and reactive to light.     Comments: Faint cataracts present  Musculoskeletal:     Cervical back: Normal range of motion and neck supple. No rigidity.  Lymphadenopathy:     Head:     Right side of head: No submental, submandibular, tonsillar, preauricular or posterior auricular adenopathy.     Left side of head: No submental, submandibular, tonsillar, preauricular or posterior auricular adenopathy.     Cervical: No cervical adenopathy.     Upper Body:     Right upper body: No supraclavicular adenopathy.     Left upper body: No supraclavicular adenopathy.  Neurological:     Mental Status: She is alert.       Lab Results  Component Value Date   CREATININE 0.82 11/17/2019    BUN 17 11/17/2019   NA 139 11/17/2019   K 3.9 11/17/2019   CL 104 11/17/2019   CO2 30 11/17/2019    Assessment & Plan:  This visit occurred during the SARS-CoV-2 public health emergency.  Safety protocols were in place, including screening questions prior to the visit, additional usage of staff PPE, and extensive cleaning of exam room while observing appropriate contact time as indicated for disinfecting solutions.   Problem List Items Addressed This Visit    Migraine with aura    Rare migraines since teenager.       Lymphadenopathy, supraclavicular    This has largely resolved - anticipate LAD due to COVID vaccine. She has upcoming mammogram scheduled next week.       Cerebral aneurysm    ?h/o this - diagnosed by neurology at an early age.  She was denied LTC insurance due to this. With BP creeping up, prudent to re evaluate this. Will order MRA brain.       Acquired renal cyst of right kidney    Benign on recent renal US.        Other Visit Diagnoses    Nonruptured cerebral aneurysm       Relevant Orders   MR Angiogram Head Wo Contrast       No orders of the defined types were placed in this encounter.  Orders Placed This Encounter  Procedures  . MR Angiogram Head Wo Contrast    Standing Status:   Future    Standing Expiration Date:   03/29/2021    Order Specific Question:   ** REASON FOR EXAM (FREE TEXT)    Answer:   eval h/o aneurysm    Order Specific Question:   What is the patient's sedation requirement?    Answer:   No Sedation    Order Specific Question:   Does the patient have a pacemaker or implanted devices?    Answer:   No    Order Specific Question:   Preferred imaging location?    Answer:   [facility] (table limit-550lbs)    Order Specific Question:   Radiology Contrast Protocol - do NOT remove file path    Answer:   [email or web address]    Patient Instructions  Lymph nodes have largely resolved.  Ok to proceed with  mammogram when you return from [employer] your upcoming trip!  We will get MRA schedule to further evaluate possible aneurysm.    Follow up plan: Return if symptoms worsen or fail to improve.  Ria Bush, MD

## 2020-01-28 NOTE — Assessment & Plan Note (Signed)
Benign on recent renal US.

## 2020-01-28 NOTE — Assessment & Plan Note (Addendum)
?  h/o this - diagnosed by neurology at an early age.  She was denied LTC insurance due to this. With BP creeping up, prudent to re evaluate this. Will order MRA brain.

## 2020-02-15 ENCOUNTER — Ambulatory Visit
Admission: RE | Admit: 2020-02-15 | Discharge: 2020-02-15 | Disposition: A | Discharge status: Home / Self Care | Payer: Medicare Other | Source: Ambulatory Visit | Attending: Family Medicine | Admitting: Family Medicine

## 2020-02-15 ENCOUNTER — Other Ambulatory Visit: Payer: Self-pay

## 2020-02-15 DIAGNOSIS — Z1231 Encounter for screening mammogram for malignant neoplasm of breast: Secondary | ICD-10-CM

## 2020-02-18 ENCOUNTER — Ambulatory Visit
Admission: RE | Admit: 2020-02-18 | Discharge: 2020-02-18 | Disposition: A | Discharge status: Home / Self Care | Payer: Medicare Other | Source: Ambulatory Visit | Attending: Family Medicine | Admitting: Family Medicine

## 2020-02-18 ENCOUNTER — Other Ambulatory Visit: Payer: Self-pay

## 2020-02-18 DIAGNOSIS — I671 Cerebral aneurysm, nonruptured: Secondary | ICD-10-CM

## 2020-02-22 ENCOUNTER — Ambulatory Visit
Admission: RE | Admit: 2020-02-22 | Discharge: 2020-02-22 | Disposition: A | Discharge status: Home / Self Care | Payer: Medicare Other | Source: Ambulatory Visit | Attending: Family Medicine | Admitting: Family Medicine

## 2020-02-22 ENCOUNTER — Other Ambulatory Visit: Payer: Self-pay

## 2020-02-22 ENCOUNTER — Encounter: Payer: Self-pay | Admitting: Family Medicine

## 2020-02-22 DIAGNOSIS — E2839 Other primary ovarian failure: Secondary | ICD-10-CM

## 2020-02-22 DIAGNOSIS — M8580 Other specified disorders of bone density and structure, unspecified site: Secondary | ICD-10-CM | POA: Insufficient documentation

## 2020-03-16 DIAGNOSIS — S93602A Unspecified sprain of left foot, initial encounter: Secondary | ICD-10-CM | POA: Insufficient documentation

## 2020-08-12 ENCOUNTER — Ambulatory Visit: Payer: Medicare Other | Admitting: Family Medicine

## 2020-12-20 ENCOUNTER — Encounter: Payer: Self-pay | Admitting: Family Medicine

## 2020-12-22 NOTE — Telephone Encounter (Signed)
[place] for patient's mom to establish with me. Thank you

## 2021-02-20 IMAGING — MR MR MRA HEAD W/O CM
1 series · 23 of 48 positions shown · non-contrast
Comparison: None.

CLINICAL DATA: Cerebral aneurysm diagnosed in [REDACTED]. Migraine
headaches.

EXAM:
MRA HEAD WITHOUT CONTRAST
TECHNIQUE: Angiographic images of the Circle of Willis were obtained using MRA
technique without intravenous contrast.

[Series 3: tof_3d_multi-slab · axial · 0.7mm · 0.35mm/px · z∈[-55,+48]mm · 23 of 156 slices shown]
[im 1/156]
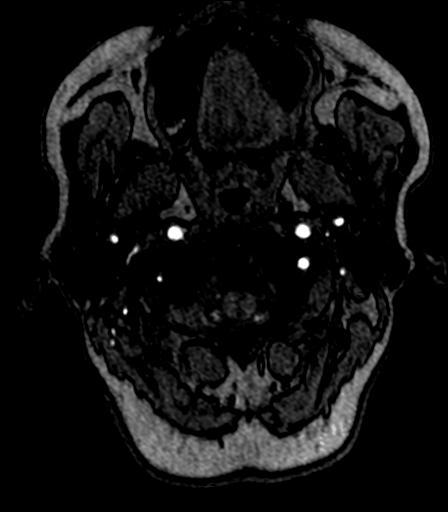
[im 4/156]
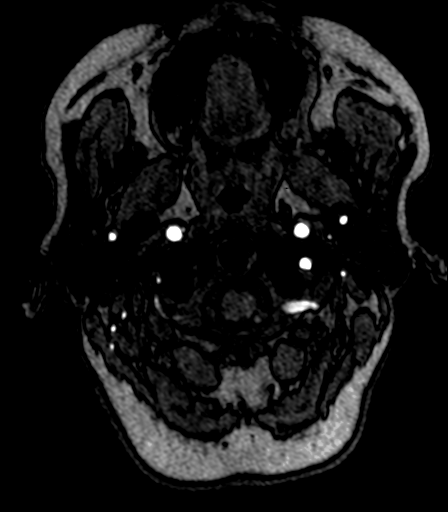
[im 7/156]
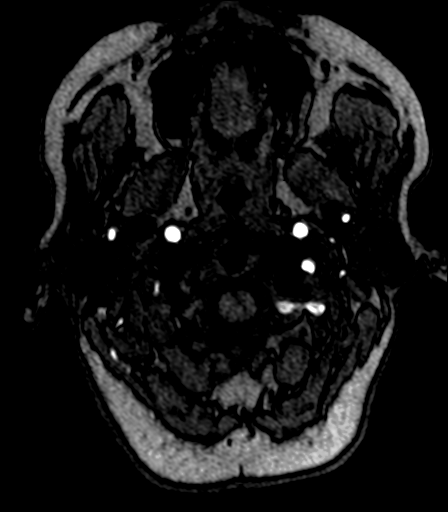
[im 10/156]
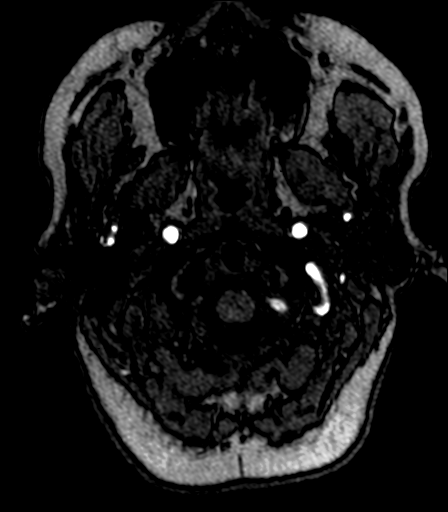
[im 14/156]
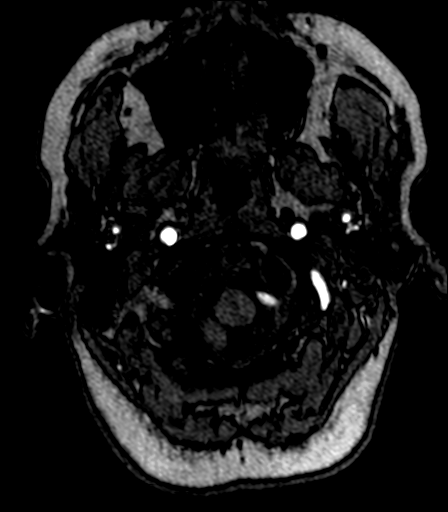
[im 17/156]
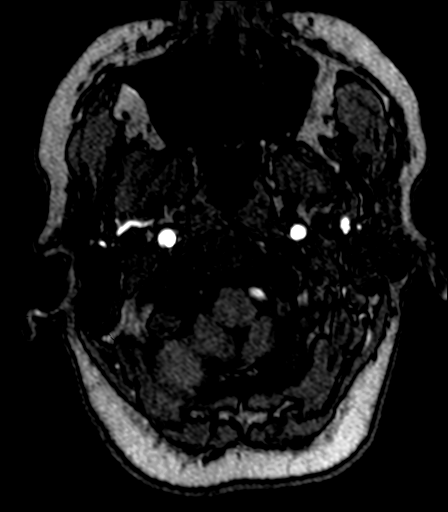
[im 20/156]
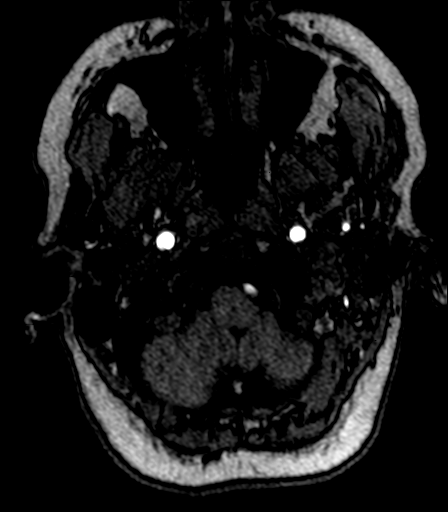
[im 24/156]
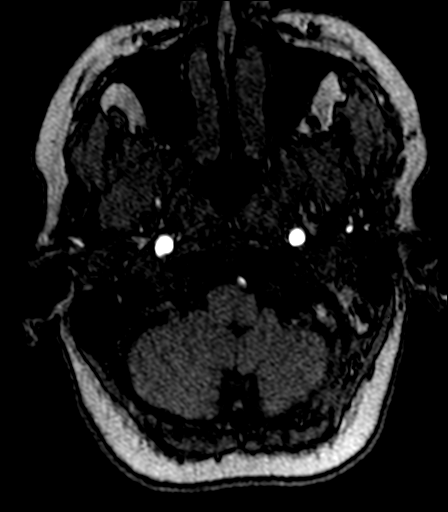
[im 27/156]
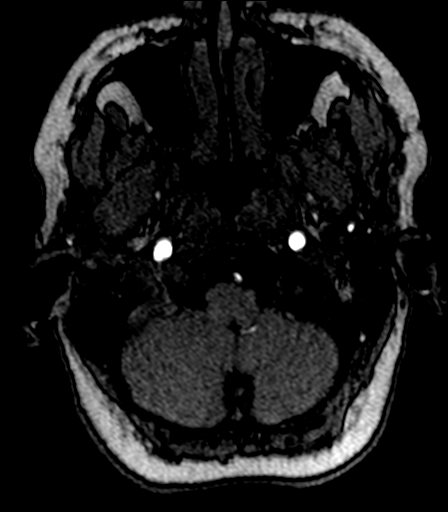
[im 30/156]
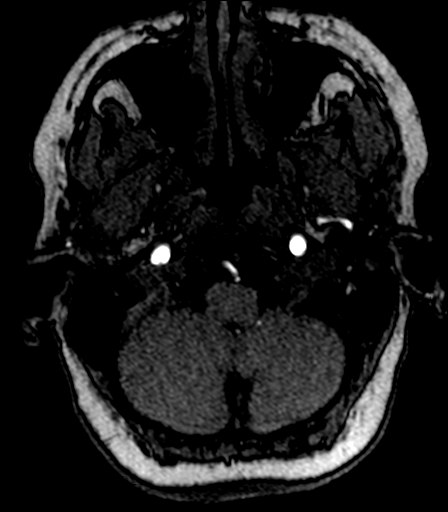
[im 33/156]
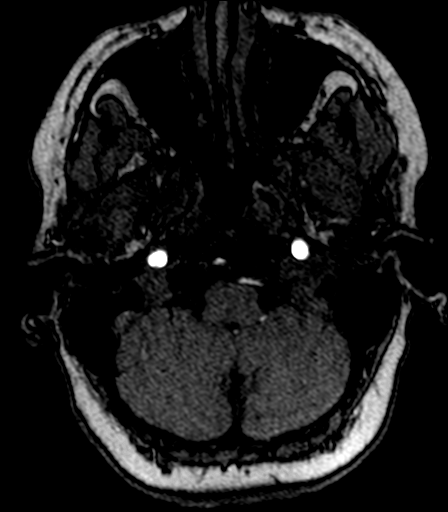
[im 37/156]
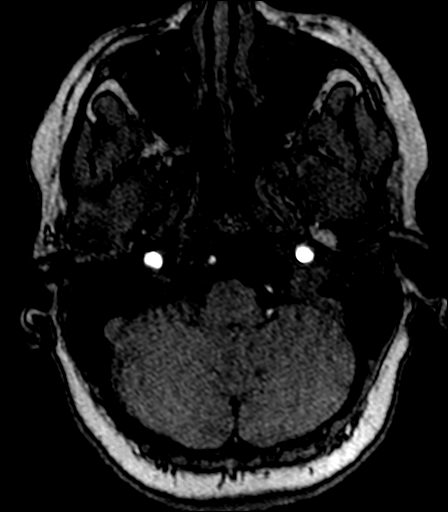
[im 40/156]
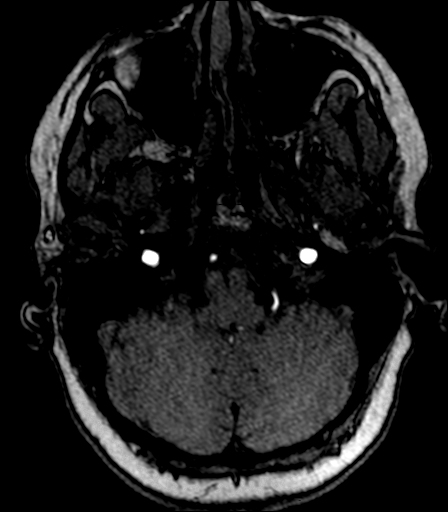
[im 43/156]
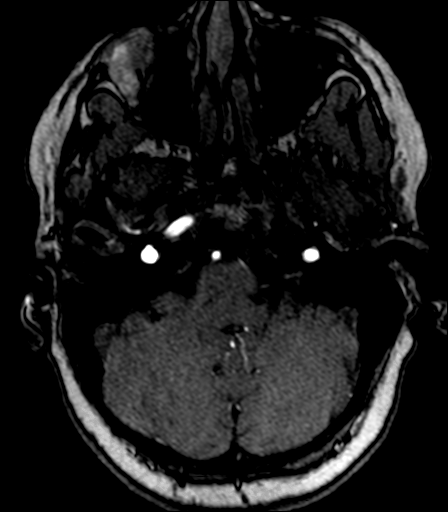
[im 47/156]
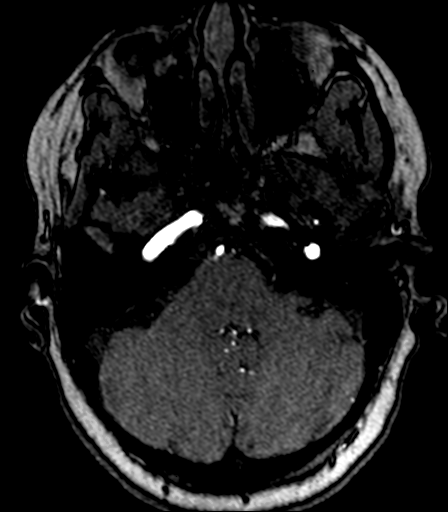
[im 50/156]
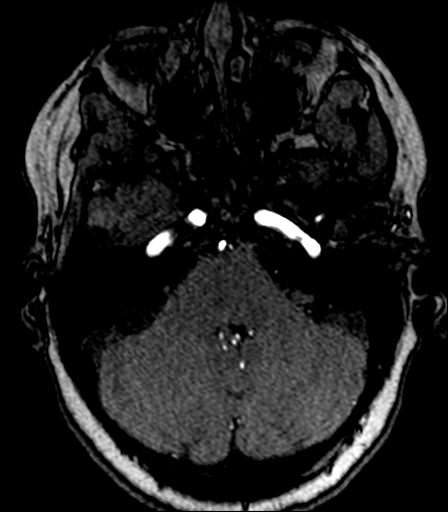
[im 70/156]
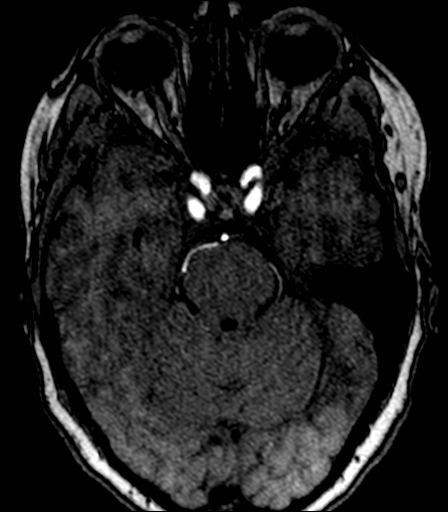
[im 80/156]
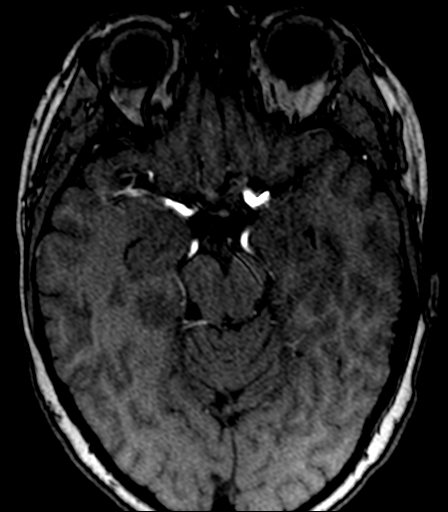
[im 90/156]
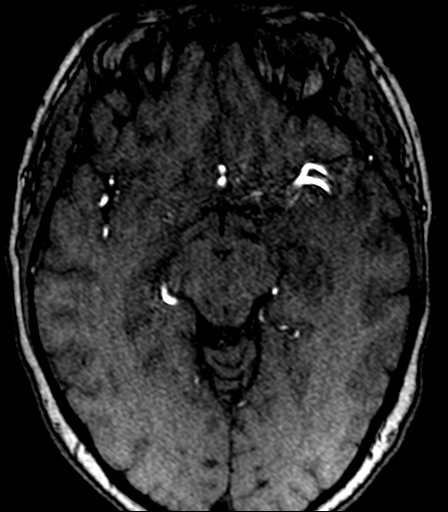
[im 109/156]
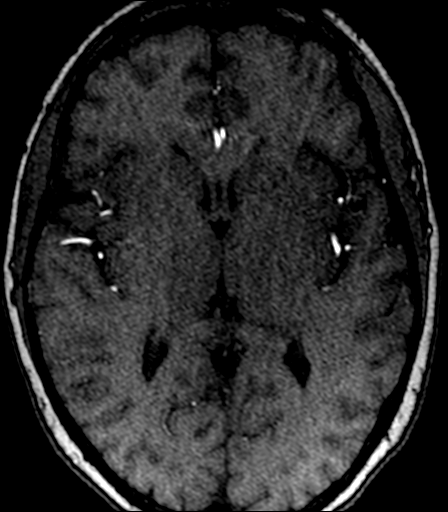
[im 129/156]
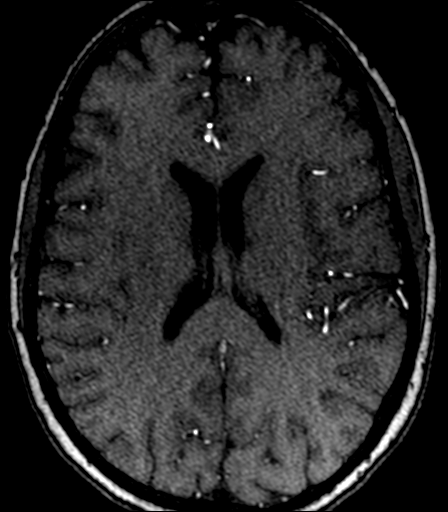
[im 132/156]
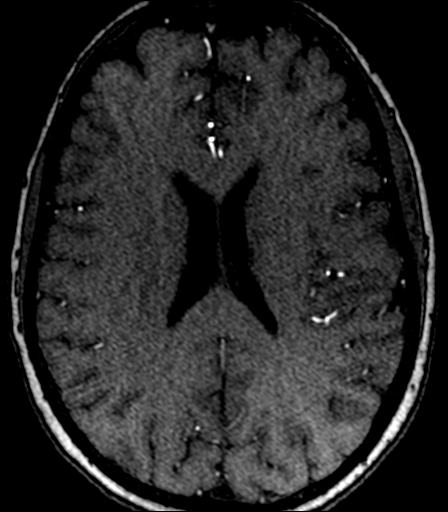
[im 149/156]
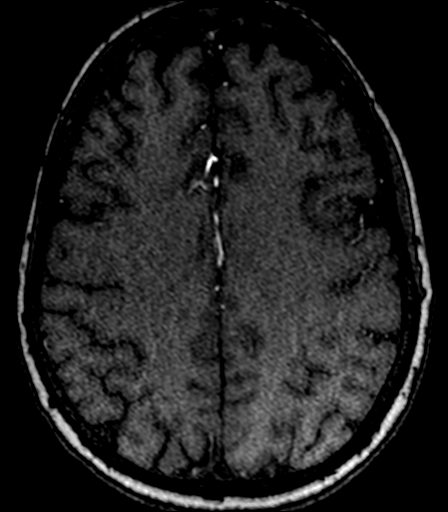

[23 of 48 positions shown; findings below may reference images not displayed]

FINDINGS: Fetal origin of the posterior cerebral artery bilaterally with
hypoplastic posterior circulation. Left vertebral artery is dominant
and supplies the basilar. Small right vertebral artery appears to
occlude at the skull base. Basilar is widely patent. Superior
cerebellar arteries patent bilaterally.

Internal carotid artery widely patent.  No stenosis or aneurysm.

Anterior and middle cerebral arteries widely patent bilaterally.
Negative for stenosis or aneurysm.
IMPRESSION: Negative for aneurysm.

No significant cranial stenosis. Fetal origin of the posterior
cerebral arteries bilaterally.

## 2021-04-17 ENCOUNTER — Encounter: Payer: Self-pay | Admitting: Physician Assistant

## 2021-04-17 ENCOUNTER — Ambulatory Visit (INDEPENDENT_AMBULATORY_CARE_PROVIDER_SITE_OTHER): Payer: Medicare Other

## 2021-04-17 ENCOUNTER — Ambulatory Visit (INDEPENDENT_AMBULATORY_CARE_PROVIDER_SITE_OTHER): Payer: Medicare Other | Admitting: Physician Assistant

## 2021-04-17 DIAGNOSIS — M25561 Pain in right knee: Secondary | ICD-10-CM | POA: Diagnosis not present

## 2021-04-17 NOTE — Progress Notes (Signed)
Office Visit Note   Patient: Maria Aguirre           Date of Birth: 09-Dec-1955           MRN: [id number] Visit Date: 04/17/2021              Requested by: Ria Bush, MD [address] [place],  [place] [address] PCP: Ria Bush, MD  Chief Complaint  Patient presents with   Right Knee - Pain      HPI: Patient is a pleasant active 65 year old woman who presents today with a complaint of right foot pain.  She denies any injuries.  She said this is been going on for few weeks.  She especially notices it when she is carrying her great grandson down the stairs.  Sometimes she has no pain that her knee will give out on her.  She also notices it if she has been sitting in a car for a while she has trouble getting comfortable with the knee.  She has been using topical Voltaren which seems to help  Assessment & Plan: Visit Diagnoses:  1. 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  Cannot rule out the possibility of a medial meniscus tear.  I have given her different choices for therapeutic treatment.  I think she would benefit greatly from close chain quadricep strengthening and she would like to try this first.  She may also take Advil to supplement the topical Voltaren gel.  Follow-Up Instructions:    Ortho Exam  Patient is alert, oriented, no adenopathy, well-dressed, normal affect, normal respiratory effort. Examination of her right knee no effusion no warmth overall well-maintained alignment.  No cellulitis or signs of infection.  She has some tenderness on the medial joint line.  No pain with terminal extension no pain today with terminal flexion.  She does have positive patellar grinding.  No tenderness over the lateral joint line.  Good endpoint on Lachman testing stable valgus and varus stressing.  Imaging: XR KNEE 3 VIEW RIGHT  Result Date: 04/17/2021 2 view x-rays of the right knee were reviewed  today.  Fairly well-preserved medial lateral compartments.  No acute osseous changes there is patellofemoral degenerative changes with spurring at the superior pole overall neutral alignment  No images are attached to the encounter.  Labs: Lab Results  Component Value Date   ESRSEDRATE 7 12/08/2019     Lab Results  Component Value Date   ALBUMIN 3.9 11/17/2019   ALBUMIN 3.9 09/03/2017   ALBUMIN 4.1 05/30/2010    No results found for: MG Lab Results  Component Value Date   VD25OH 30.01 11/17/2019   VD25OH 23.78 (L) 09/03/2017   VD25OH 31.12 01/10/2015    No results found for: PREALBUMIN CBC EXTENDED Latest Ref Rng & Units 12/08/2019 09/03/2017 08/03/2009  WBC 4.0 - 10.5 K/uL 7.8 4.8 -  RBC 3.87 - 5.11 Mil/uL 4.68 4.84 -  HGB 12.0 - 15.0 g/dL 13.5 14.1 14.1  HCT 36.0 - 46.0 % 39.8 42.2 -  PLT 150.0 - 400.0 K/uL 228.0 225.0 -  NEUTROABS 1.4 - 7.7 K/uL 4.5 2.8 -  LYMPHSABS 0.7 - 4.0 K/uL 2.3 1.5 -     There is no height or weight on file to calculate BMI.  Orders:  Orders Placed This Encounter  Procedures   XR KNEE 3 VIEW RIGHT   No orders of the defined types were placed in  this encounter.    Procedures: No procedures performed  Clinical Data: No additional findings.  ROS:  All other systems negative, except as noted in the HPI. Review of Systems  Objective: Vital Signs: There were no vitals taken for this visit.  Specialty Comments:  No specialty comments available.  PMFS History: Patient Active Problem List   Diagnosis Date Noted   Osteopenia 02/22/2020   Migraine with aura 01/28/2020   Welcome to Medicare preventive visit 12/08/2019   Advanced care planning/counseling discussion 12/08/2019   Lymphadenopathy, supraclavicular 12/08/2019   Periodontal disease 12/08/2019   Acquired renal cyst of right kidney 09/03/2017   Rosacea 09/03/2017   Health maintenance examination 01/10/2015   Cerebral aneurysm    Anxiety state 01/24/2009   CARPAL TUNNEL  SYNDROME, BILATERAL 01/24/2009   Vitamin D deficiency 07/05/2008   HYPERCHOLESTEROLEMIA 07/05/2008   GERD 07/05/2008   OVARIAN CYST 3CM RIGHT 07/05/2008   Past Medical History:  Diagnosis Date   Allergy    Cerebral aneurysm remote   ?of this - told by neurologist, was told at age 89   GERD (gastroesophageal reflux disease)    PRN   History of migraine with aura    "no migraine in a long time"   Migraine with aura 01/28/2020   Rosacea    dairy flares up rosacea   Seasonal allergies     Family History  Problem Relation Age of Onset   CAD Father        stents   Colon cancer Maternal Grandmother 55   CAD Paternal Grandmother    Heart failure Paternal Grandmother    Parkinson's disease Maternal Grandfather    Thyroid disease Mother    High Cholesterol Mother    Heart disease Sister 65       pacemaker   CAD Paternal Uncle        MI   Diabetes Neg Hx    Esophageal cancer Neg Hx    Stomach cancer Neg Hx    Rectal cancer Neg Hx    Colon polyps Neg Hx     Past Surgical History:  Procedure Laterality Date   APPENDECTOMY  2004   BUNIONECTOMY Bilateral    CARPAL TUNNEL RELEASE Bilateral    COLONOSCOPY  10/2017   TAs, diverticulosis, rpt 3 yrs ([facility])   TUBAL LIGATION     Social History   Occupational History   Not on file  Tobacco Use   Smoking status: Never   Smokeless tobacco: Never  Vaping Use   Vaping Use: Never used  Substance and Sexual Activity   Alcohol use: Yes    Comment: occasionally bi-weekly beer per pt   Drug use: No   Sexual activity: Yes    Birth control/protection: None

## 2021-05-01 ENCOUNTER — Encounter: Payer: Self-pay | Admitting: Gastroenterology

## 2021-06-17 ENCOUNTER — Ambulatory Visit (INDEPENDENT_AMBULATORY_CARE_PROVIDER_SITE_OTHER): Payer: Medicare Other

## 2021-06-17 DIAGNOSIS — Z0000 Encounter for general adult medical examination without abnormal findings: Secondary | ICD-10-CM | POA: Diagnosis not present

## 2021-06-17 NOTE — Progress Notes (Signed)
Subjective:   Maria Aguirre is a 65 y.o. female who presents for Medicare Annual (Subsequent) preventive examination.  I connected with  Maria Aguirre on 06/17/21 by a audio enabled telemedicine application and verified that I am speaking with the correct person using two identifiers. Location of patient: Home Location of provider: Office Person participating Maria Aguirre (patient) and Beckie Salts CMA.   I discussed the limitations of evaluation and management by telemedicine. The patient expressed understanding and agreed to proceed.  Review of Systems    Defer to PCP.       Objective:    There were no vitals filed for this visit. There is no height or weight on file to calculate BMI.  Advanced Directives 04/25/2015  Does Patient Have a Medical Advance Directive? No    Current Medications (verified) Outpatient Encounter Medications as of 06/17/2021  Medication Sig   ASPIRIN 81 PO Take 1 tablet by mouth daily.   B COMPLEX VITAMINS PO Take by mouth.   Calcium Carbonate 260 MG CHEW Chew 2 tablets by mouth 2 (two) times daily.   cholecalciferol (VITAMIN D) 1000 UNITS tablet Take 1,000 Units by mouth 2 (two) times daily.   diphenhydrAMINE (BENADRYL) 25 mg capsule Take 25 mg by mouth at bedtime. As needed   diphenhydramine-acetaminophen (TYLENOL PM) 25-500 MG TABS tablet Take 1 tablet by mouth at bedtime as needed.   famotidine (PEPCID) 20 MG tablet Take 20 mg by mouth daily.   vitamin C (ASCORBIC ACID) 500 MG tablet Take 500 mg by mouth 2 (two) times daily.   No facility-administered encounter medications on file as of 06/17/2021.    Allergies (verified) Shellfish allergy and Lactase   History: Past Medical History:  Diagnosis Date   Allergy    Cerebral aneurysm remote   ?of this - told by neurologist, was told at age 42   GERD (gastroesophageal reflux disease)    PRN   History of migraine with aura    "no migraine in a long time"   Migraine with aura 01/28/2020   Rosacea     dairy flares up rosacea   Seasonal allergies    Past Surgical History:  Procedure Laterality Date   APPENDECTOMY  2004   BUNIONECTOMY Bilateral    CARPAL TUNNEL RELEASE Bilateral    COLONOSCOPY  10/2017   TAs, diverticulosis, rpt 3 yrs ([facility])   TUBAL LIGATION     Family History  Problem Relation Age of Onset   CAD Father        stents   Colon cancer Maternal Grandmother 39   CAD Paternal Grandmother    Heart failure Paternal Grandmother    Parkinson's disease Maternal Grandfather    Thyroid disease Mother    High Cholesterol Mother    Heart disease Sister 36       pacemaker   CAD Paternal Uncle        MI   Diabetes Neg Hx    Esophageal cancer Neg Hx    Stomach cancer Neg Hx    Rectal cancer Neg Hx    Colon polyps Neg Hx    Social History   Socioeconomic History   Marital status: Married    Spouse name: Not on file   Number of children: Not on file   Years of education: Not on file   Highest education level: Not on file  Occupational History   Not on file  Tobacco Use   Smoking status: Never   Smokeless tobacco:  Never  Vaping Use   Vaping Use: Never used  Substance and Sexual Activity   Alcohol use: Yes    Comment: occasionally bi-weekly beer per pt   Drug use: No   Sexual activity: Yes    Birth control/protection: None  Other Topics Concern   Not on file  Social History Narrative   Lives with husband Hospital doctor) and mother, dog   Occupation: retired from post office, working full time at [employer]   Activity: walking at work, [employer], Haematologist   Diet: good water, fruits/vegetables daily   Social Determinants of Radio broadcast assistant Strain: Not on Art therapist Insecurity: Not on file  Transportation Needs: Not on file  Physical Activity: Not on file  Stress: Not on file  Social Connections: Not on file    Tobacco Counseling Counseling given: Not Answered   Clinical Intake:                 Diabetic?No          Activities of Daily Living No flowsheet data found.  Patient Care Team: Ria Bush, MD as PCP - General (Family Medicine)  Indicate any recent Medical Services you may have received from other than [facility] providers in the past year (date may be approximate).     Assessment:   This is a routine wellness examination for Maria Aguirre.  Hearing/Vision screen No results found.  Dietary issues and exercise activities discussed:     Goals Addressed   None    Depression Screen PHQ 2/9 Scores 12/08/2019 09/03/2017  PHQ - 2 Score 0 1    Fall Risk Fall Risk  12/08/2019  Falls in the past year? 0    FALL RISK PREVENTION PERTAINING TO THE HOME:  Any stairs in or around the home? Yes  If so, are there any without handrails? No  Home free of loose throw rugs in walkways, pet beds, electrical cords, etc? No  Adequate lighting in your home to reduce risk of falls? No   ASSISTIVE DEVICES UTILIZED TO PREVENT FALLS:  Life alert? No  Use of a cane, walker or w/c? No  Grab bars in the bathroom? No  Shower chair or bench in shower? No  Elevated toilet seat or a handicapped toilet? Yes   TIMED UP AND GO:  Was the test performed? No .  Length of time to ambulate 10 feet:    Cognitive Function:        Immunizations Immunization History  Administered Date(s) Administered   Influenza Whole 06/30/2008, 08/23/2009   Influenza-Unspecified 07/02/2013, 07/08/2014, 08/20/2017, 08/10/2019   PFIZER(Purple Top)SARS-COV-2 Vaccination 12/04/2019, 12/29/2019   Td 05/31/2009    TDAP status: Due, Education has been provided regarding the importance of this vaccine. Advised may receive this vaccine at local pharmacy or Health Dept. Aware to provide a copy of the vaccination record if obtained from local pharmacy or Health Dept. Verbalized acceptance and understanding.  Flu Vaccine status: Due, Education has been provided regarding the importance of this vaccine. Advised may receive this  vaccine at local pharmacy or Health Dept. Aware to provide a copy of the vaccination record if obtained from local pharmacy or Health Dept. Verbalized acceptance and understanding.  Pneumococcal vaccine status: Due, Education has been provided regarding the importance of this vaccine. Advised may receive this vaccine at local pharmacy or Health Dept. Aware to provide a copy of the vaccination record if obtained from local pharmacy or Health Dept. Verbalized acceptance and understanding.  Covid-19  vaccine status: Declined, Education has been provided regarding the importance of this vaccine but patient still declined. Advised may receive this vaccine at local pharmacy or Health Dept.or vaccine clinic. Aware to provide a copy of the vaccination record if obtained from local pharmacy or Health Dept. Verbalized acceptance and understanding.  Qualifies for Shingles Vaccine? Yes   Zostavax completed  No   Shingrix Completed?: Yes  Screening Tests Health Maintenance  Topic Date Due   Zoster Vaccines- Shingrix (1 of 2) Never done   TETANUS/TDAP  06/01/2019   PNA vac Low Risk Adult (1 of 2 - PCV13) Never done   COVID-19 Vaccine (3 - Booster for Pfizer series) 05/30/2020   COLONOSCOPY (Pts 45-20yr Insurance coverage will need to be confirmed)  10/22/2020   INFLUENZA VACCINE  05/08/2021   MAMMOGRAM  02/14/2022   DEXA SCAN  Completed   Hepatitis C Screening  Completed   HPV VACCINES  Aged Out    Health Maintenance  Health Maintenance Due  Topic Date Due   Zoster Vaccines- Shingrix (1 of 2) Never done   TETANUS/TDAP  06/01/2019   PNA vac Low Risk Adult (1 of 2 - PCV13) Never done   COVID-19 Vaccine (3 - Booster for Pfizer series) 05/30/2020   COLONOSCOPY (Pts 45-476yrInsurance coverage will need to be confirmed)  10/22/2020   INFLUENZA VACCINE  05/08/2021    Colorectal cancer screening: Referral to GI placed NO. Pt aware the office will call re: appt.  Mammogram status: Ordered NO. Pt  provided with contact info and advised to call to schedule appt.   Bone Density status: Ordered NO. Pt provided with contact info and advised to call to schedule appt.  Lung Cancer Screening: (Low Dose CT Chest recommended if Age 67-80ears, 30 pack-year currently smoking OR have quit w/in 15years.) does not qualify.   Lung Cancer Screening Referral:   Additional Screening:  Hepatitis C Screening: does qualify; Completed Yes  Vision Screening: Recommended annual ophthalmology exams for early detection of glaucoma and other disorders of the eye. Is the patient up to date with their annual eye exam?  Yes  Who is the provider or what is the name of the office in which the patient attends annual eye exams? [facility]. If pt is not established with a provider, would they like to be referred to a provider to establish care? No .   Dental Screening: Recommended annual dental exams for proper oral hygiene  Community Resource Referral / Chronic Care Management: CRR required this visit?  No   CCM required this visit?  No      Plan:     I have personally reviewed and noted the following in the patient's chart:   Medical and social history Use of alcohol, tobacco or illicit drugs  Current medications and supplements including opioid prescriptions.  Functional ability and status Nutritional status Physical activity Advanced directives List of other physicians Hospitalizations, surgeries, and ER visits in previous 12 months Vitals Screenings to include cognitive, depression, and falls Referrals and appointments  In addition, I have reviewed and discussed with patient certain preventive protocols, quality metrics, and best practice recommendations. A written personalized care plan for preventive services as well as general preventive health recommendations were provided to patient.     ShBeckie SaltsRMSweet Grass 06/17/2021   Nurse Notes: Non face to face 60 minute visit  Ms. WaPlimpton Thank  you for taking time to come for your Medicare Wellness Visit. I appreciate  your ongoing commitment to your health goals. Please review the following plan we discussed and let me know if I can assist you in the future.   These are the goals we discussed:  Goals   None     This is a list of the screening recommended for you and due dates:  Health Maintenance  Topic Date Due   Zoster (Shingles) Vaccine (1 of 2) Never done   Tetanus Vaccine  06/01/2019   Pneumonia vaccines (1 of 2 - PCV13) Never done   COVID-19 Vaccine (3 - Booster for Pfizer series) 05/30/2020   Colon Cancer Screening  10/22/2020   Flu Shot  05/08/2021   Mammogram  02/14/2022   DEXA scan (bone density measurement)  Completed   Hepatitis C Screening: USPSTF Recommendation to screen - Ages 18-79 yo.  Completed   HPV Vaccine  Aged Out

## 2021-06-17 NOTE — Patient Instructions (Signed)
Health Maintenance, Female Adopting a healthy lifestyle and getting preventive care are important in promoting health and wellness. Ask your health care provider about: The right schedule for you to have regular tests and exams. Things you can do on your own to prevent diseases and keep yourself healthy. What should I know about diet, weight, and exercise? Eat a healthy diet  Eat a diet that includes plenty of vegetables, fruits, low-fat dairy products, and lean protein. Do not eat a lot of foods that are high in solid fats, added sugars, or sodium. Maintain a healthy weight Body mass index (BMI) is used to identify weight problems. It estimates body fat based on height and weight. Your health care provider can help determine your BMI and help you achieve or maintain a healthy weight. Get regular exercise Get regular exercise. This is one of the most important things you can do for your health. Most adults should: Exercise for at least 150 minutes each week. The exercise should increase your heart rate and make you sweat (moderate-intensity exercise). Do strengthening exercises at least twice a week. This is in addition to the moderate-intensity exercise. Spend less time sitting. Even light physical activity can be beneficial. Watch cholesterol and blood lipids Have your blood tested for lipids and cholesterol at 67 years of age, then have this test every 5 years. Have your cholesterol levels checked more often if: Your lipid or cholesterol levels are high. You are older than 67 years of age. You are at high risk for heart disease. What should I know about cancer screening? Depending on your health history and family history, you may need to have cancer screening at various ages. This may include screening for: Breast cancer. Cervical cancer. Colorectal cancer. Skin cancer. Lung cancer. What should I know about heart disease, diabetes, and high blood pressure? Blood pressure and heart  disease High blood pressure causes heart disease and increases the risk of stroke. This is more likely to develop in people who have high blood pressure readings, are of African descent, or are overweight. Have your blood pressure checked: Every 3-5 years if you are 18-39 years of age. Every year if you are 40 years old or older. Diabetes Have regular diabetes screenings. This checks your fasting blood sugar level. Have the screening done: Once every three years after age 40 if you are at a normal weight and have a low risk for diabetes. More often and at a younger age if you are overweight or have a high risk for diabetes. What should I know about preventing infection? Hepatitis B If you have a higher risk for hepatitis B, you should be screened for this virus. Talk with your health care provider to find out if you are at risk for hepatitis B infection. Hepatitis C Testing is recommended for: Everyone born from 1945 through 1965. Anyone with known risk factors for hepatitis C. Sexually transmitted infections (STIs) Get screened for STIs, including gonorrhea and chlamydia, if: You are sexually active and are younger than 67 years of age. You are older than 67 years of age and your health care provider tells you that you are at risk for this type of infection. Your sexual activity has changed since you were last screened, and you are at increased risk for chlamydia or gonorrhea. Ask your health care provider if you are at risk. Ask your health care provider about whether you are at high risk for HIV. Your health care provider may recommend a prescription medicine   to help prevent HIV infection. If you choose to take medicine to prevent HIV, you should first get tested for HIV. You should then be tested every 3 months for as long as you are taking the medicine. Pregnancy If you are about to stop having your period (premenopausal) and you may become pregnant, seek counseling before you get  pregnant. Take 400 to 800 micrograms (mcg) of folic acid every day if you become pregnant. Ask for birth control (contraception) if you want to prevent pregnancy. Osteoporosis and menopause Osteoporosis is a disease in which the bones lose minerals and strength with aging. This can result in bone fractures. If you are 65 years old or older, or if you are at risk for osteoporosis and fractures, ask your health care provider if you should: Be screened for bone loss. Take a calcium or vitamin D supplement to lower your risk of fractures. Be given hormone replacement therapy (HRT) to treat symptoms of menopause. Follow these instructions at home: Lifestyle Do not use any products that contain nicotine or tobacco, such as cigarettes, e-cigarettes, and chewing tobacco. If you need help quitting, ask your health care provider. Do not use street drugs. Do not share needles. Ask your health care provider for help if you need support or information about quitting drugs. Alcohol use Do not drink alcohol if: Your health care provider tells you not to drink. You are pregnant, may be pregnant, or are planning to become pregnant. If you drink alcohol: Limit how much you use to 0-1 drink a day. Limit intake if you are breastfeeding. Be aware of how much alcohol is in your drink. In the [place] one drink equals one 12 oz bottle of beer (355 mL), one 5 oz glass of wine (148 mL), or one 1 oz glass of hard liquor (44 mL). General instructions Schedule regular health, dental, and eye exams. Stay current with your vaccines. Tell your health care provider if: You often feel depressed. You have ever been abused or do not feel safe at home. Summary Adopting a healthy lifestyle and getting preventive care are important in promoting health and wellness. Follow your health care provider's instructions about healthy diet, exercising, and getting tested or screened for diseases. Follow your health care provider's  instructions on monitoring your cholesterol and blood pressure. This information is not intended to replace advice given to you by your health care provider. Make sure you discuss any questions you have with your health care provider. Document Revised: 12/02/2020 Document Reviewed: 09/17/2018 Elsevier Patient Education  2022 [employer]  

## 2021-06-21 NOTE — Progress Notes (Signed)
I reviewed health advisor's note, was available for consultation, and agree with documentation and plan.  

## 2021-09-16 ENCOUNTER — Encounter: Payer: Self-pay | Admitting: Family Medicine

## 2021-10-13 ENCOUNTER — Telehealth: Payer: Medicare Other | Admitting: Physician Assistant

## 2021-10-13 DIAGNOSIS — B9689 Other specified bacterial agents as the cause of diseases classified elsewhere: Secondary | ICD-10-CM | POA: Diagnosis not present

## 2021-10-13 DIAGNOSIS — J208 Acute bronchitis due to other specified organisms: Secondary | ICD-10-CM

## 2021-10-13 MED ORDER — AZITHROMYCIN 250 MG PO TABS: ORAL_TABLET | ORAL | 0 refills | Status: AC

## 2021-10-13 MED ORDER — PREDNISONE 20 MG PO TABS: 40.0000 mg | ORAL_TABLET | Freq: Every day | ORAL | 0 refills | Status: DC

## 2021-10-13 MED ORDER — BENZONATATE 100 MG PO CAPS: 100.0000 mg | ORAL_CAPSULE | Freq: Three times a day (TID) | ORAL | 0 refills | Status: DC | PRN

## 2021-10-13 NOTE — Progress Notes (Signed)
Virtual Visit Consent   Maria Aguirre, you are scheduled for a virtual visit with a [facility] provider today.     Just as with appointments in the office, your consent must be obtained to participate.  Your consent will be active for this visit and any virtual visit you may have with one of our providers in the next 365 days.     If you have a MyChart account, a copy of this consent can be sent to you electronically.  All virtual visits are billed to your insurance company just like a traditional visit in the office.    As this is a virtual visit, video technology does not allow for your provider to perform a traditional examination.  This may limit your provider's ability to fully assess your condition.  If your provider identifies any concerns that need to be evaluated in person or the need to arrange testing (such as labs, EKG, etc.), we will make arrangements to do so.     Although advances in technology are sophisticated, we cannot ensure that it will always work on either your end or our end.  If the connection with a video visit is poor, the visit may have to be switched to a telephone visit.  With either a video or telephone visit, we are not always able to ensure that we have a secure connection.     I need to obtain your verbal consent now.   Are you willing to proceed with your visit today?    QUANESHIA WAREING has provided verbal consent on 10/13/2021 for a virtual visit (video or telephone).   Mar Daring, PA-C   Date: 10/13/2021 9:57 AM   Virtual Visit via Video Note   I, Mar Daring, connected with  Maria Aguirre  ([id number], 1956/05/04) on 10/13/21 at  9:45 AM EST by a video-enabled telemedicine application and verified that I am speaking with the correct person using two identifiers.  Location: Patient: Virtual Visit Location Patient: Home Provider: Virtual Visit Location Provider: Home Office   I discussed the limitations of evaluation and management by  telemedicine and the availability of in person appointments. The patient expressed understanding and agreed to proceed.    History of Present Illness: Maria Aguirre is a 66 y.o. who identifies as a female who was assigned female at birth, and is being seen today for cough.  HPI: Cough This is a new problem. The current episode started 1 to 4 weeks ago (17 days). The problem has been gradually worsening. The problem occurs every few minutes. The cough is Non-productive. Associated symptoms include chest pain (with cough), chills, a fever (first 2 or 3 days), myalgias, nasal congestion, postnasal drip and rhinorrhea. Pertinent negatives include no sore throat, shortness of breath or wheezing. Treatments tried: theraflu, mucinex, humidifier.   Mother tested positive for covid 19, but is improving; she tested negative  Problems:  Patient Active Problem List   Diagnosis Date Noted   Osteopenia 02/22/2020   Migraine with aura 01/28/2020   Welcome to Medicare preventive visit 12/08/2019   Advanced care planning/counseling discussion 12/08/2019   Lymphadenopathy, supraclavicular 12/08/2019   Periodontal disease 12/08/2019   Acquired renal cyst of right kidney 09/03/2017   Rosacea 09/03/2017   Health maintenance examination 01/10/2015   Cerebral aneurysm    Anxiety state 01/24/2009   CARPAL TUNNEL SYNDROME, BILATERAL 01/24/2009   Vitamin D deficiency 07/05/2008   HYPERCHOLESTEROLEMIA 07/05/2008   GERD 07/05/2008   OVARIAN  CYST 3CM RIGHT 07/05/2008    Allergies:  Allergies  Allergen Reactions   Shellfish Allergy Hives   Tilactase Rash    Face   Medications:  Current Outpatient Medications:    azithromycin (ZITHROMAX) 250 MG tablet, Take 2 tablets on day 1, then 1 tablet daily on days 2 through 5, Disp: 6 tablet, Rfl: 0   benzonatate (TESSALON) 100 MG capsule, Take 1 capsule (100 mg total) by mouth 3 (three) times daily as needed., Disp: 30 capsule, Rfl: 0   predniSONE (DELTASONE) 20 MG  tablet, Take 2 tablets (40 mg total) by mouth daily with breakfast., Disp: 10 tablet, Rfl: 0   ASPIRIN 81 PO, Take 1 tablet by mouth daily., Disp: , Rfl:    B COMPLEX VITAMINS PO, Take by mouth., Disp: , Rfl:    Calcium Carbonate 260 MG CHEW, Chew 2 tablets by mouth 2 (two) times daily., Disp: , Rfl:    cholecalciferol (VITAMIN D) 1000 UNITS tablet, Take 1,000 Units by mouth 2 (two) times daily., Disp: , Rfl:    diphenhydrAMINE (BENADRYL) 25 mg capsule, Take 25 mg by mouth at bedtime. As needed, Disp: , Rfl:    diphenhydramine-acetaminophen (TYLENOL PM) 25-500 MG TABS tablet, Take 1 tablet by mouth at bedtime as needed., Disp: , Rfl:    famotidine (PEPCID) 20 MG tablet, Take 20 mg by mouth daily., Disp: , Rfl:    vitamin C (ASCORBIC ACID) 500 MG tablet, Take 500 mg by mouth 2 (two) times daily., Disp: , Rfl:   Observations/Objective: Patient is well-developed, well-nourished in no acute distress.  Resting comfortably at home.  Head is normocephalic, atraumatic.  No labored breathing.  Speech is clear and coherent with logical content.  Patient is alert and oriented at baseline.    Assessment and Plan: 1. Acute bacterial bronchitis - azithromycin (ZITHROMAX) 250 MG tablet; Take 2 tablets on day 1, then 1 tablet daily on days 2 through 5  Dispense: 6 tablet; Refill: 0 - predniSONE (DELTASONE) 20 MG tablet; Take 2 tablets (40 mg total) by mouth daily with breakfast.  Dispense: 10 tablet; Refill: 0 - benzonatate (TESSALON) 100 MG capsule; Take 1 capsule (100 mg total) by mouth 3 (three) times daily as needed.  Dispense: 30 capsule; Refill: 0  - Worsening over 2 weeks.  - Will treat with zpak, prednisone and tessalon perles.  - Steam and humidifier - Echinacea essential oil in steam - Push fluids.  - Rest.  - Seek in person evaluation if worsening.   Follow Up Instructions: I discussed the assessment and treatment plan with the patient. The patient was provided an opportunity to ask  questions and all were answered. The patient agreed with the plan and demonstrated an understanding of the instructions.  A copy of instructions were sent to the patient via MyChart unless otherwise noted below.    The patient was advised to call back or seek an in-person evaluation if the symptoms worsen or if the condition fails to improve as anticipated.  Time:  I spent 13 minutes with the patient via telehealth technology discussing the above problems/concerns.    Mar Daring, PA-C

## 2021-10-13 NOTE — Patient Instructions (Signed)
Maria Aguirre, thank you for joining Mar Daring, PA-C for today's virtual visit.  While this provider is not your primary care provider (PCP), if your PCP is located in our provider database this encounter information will be shared with them immediately following your visit.  Consent: (Patient) Maria Aguirre provided verbal consent for this virtual visit at the beginning of the encounter.  Current Medications:  Current Outpatient Medications:    azithromycin (ZITHROMAX) 250 MG tablet, Take 2 tablets on day 1, then 1 tablet daily on days 2 through 5, Disp: 6 tablet, Rfl: 0   benzonatate (TESSALON) 100 MG capsule, Take 1 capsule (100 mg total) by mouth 3 (three) times daily as needed., Disp: 30 capsule, Rfl: 0   predniSONE (DELTASONE) 20 MG tablet, Take 2 tablets (40 mg total) by mouth daily with breakfast., Disp: 10 tablet, Rfl: 0   ASPIRIN 81 PO, Take 1 tablet by mouth daily., Disp: , Rfl:    B COMPLEX VITAMINS PO, Take by mouth., Disp: , Rfl:    Calcium Carbonate 260 MG CHEW, Chew 2 tablets by mouth 2 (two) times daily., Disp: , Rfl:    cholecalciferol (VITAMIN D) 1000 UNITS tablet, Take 1,000 Units by mouth 2 (two) times daily., Disp: , Rfl:    diphenhydrAMINE (BENADRYL) 25 mg capsule, Take 25 mg by mouth at bedtime. As needed, Disp: , Rfl:    diphenhydramine-acetaminophen (TYLENOL PM) 25-500 MG TABS tablet, Take 1 tablet by mouth at bedtime as needed., Disp: , Rfl:    famotidine (PEPCID) 20 MG tablet, Take 20 mg by mouth daily., Disp: , Rfl:    vitamin C (ASCORBIC ACID) 500 MG tablet, Take 500 mg by mouth 2 (two) times daily., Disp: , Rfl:    Medications ordered in this encounter:  Meds ordered this encounter  Medications   azithromycin (ZITHROMAX) 250 MG tablet    Sig: Take 2 tablets on day 1, then 1 tablet daily on days 2 through 5    Dispense:  6 tablet    Refill:  0    Order Specific Question:   Supervising Provider    Answer:   Sabra Heck, BRIAN [[id number]]   predniSONE  (DELTASONE) 20 MG tablet    Sig: Take 2 tablets (40 mg total) by mouth daily with breakfast.    Dispense:  10 tablet    Refill:  0    Order Specific Question:   Supervising Provider    Answer:   MILLER, BRIAN [[id number]]   benzonatate (TESSALON) 100 MG capsule    Sig: Take 1 capsule (100 mg total) by mouth 3 (three) times daily as needed.    Dispense:  30 capsule    Refill:  0    Order Specific Question:   Supervising Provider    Answer:   Sabra Heck, [place]     *If you need refills on other medications prior to your next appointment, please contact your pharmacy*  Follow-Up: Call back or seek an in-person evaluation if the symptoms worsen or if the condition fails to improve as anticipated.  Other Instructions Acute Bronchitis, Adult Acute bronchitis is sudden inflammation of the main airways (bronchi) that come off the windpipe (trachea) in the lungs. The swelling causes the airways to get smaller and make more mucus than normal. This can make it hard to breathe and can cause coughing or noisy breathing (wheezing). Acute bronchitis may last several weeks. The cough may last longer. Allergies, asthma, and exposure to smoke may make the  condition worse. What are the causes? This condition can be caused by germs and by substances that irritate the lungs, including: Cold and flu viruses. The most common cause of this condition is the virus that causes the common cold. Bacteria. This is less common. Breathing in substances that irritate the lungs, including: Smoke from cigarettes and other forms of tobacco. Dust and pollen. Fumes from household cleaning products, gases, or burned fuel. Indoor or outdoor air pollution. What increases the risk? The following factors may make you more likely to develop this condition: A weak body's defense system, also called the immune system. A condition that affects your lungs and breathing, such as asthma. What are the signs or symptoms? Common  symptoms of this condition include: Coughing. This may bring up clear, yellow, or green mucus from your lungs (sputum). Wheezing. Runny or stuffy nose. Having too much mucus in your lungs (chest congestion). Shortness of breath. Aches and pains, including sore throat or chest. How is this diagnosed? This condition is usually diagnosed based on: Your symptoms and medical history. A physical exam. You may also have other tests, including tests to rule out other conditions, such as pneumonia. These tests include: A test of lung function. Test of a mucus sample to look for the presence of bacteria. Tests to check the oxygen level in your blood. Blood tests. Chest X-ray. How is this treated? Most cases of acute bronchitis clear up over time without treatment. Your health care provider may recommend: Drinking more fluids to help thin your mucus so it is easier to cough up. Taking inhaled medicine (inhaler) to improve air flow in and out of your lungs. Using a vaporizer or a humidifier. These are machines that add water to the air to help you breathe better. Taking a medicine that thins mucus and clears congestion (expectorant). Taking a medicine that prevents or stops coughing (cough suppressant). It is notcommon to take an antibiotic medicine for this condition. Follow these instructions at home:  Take over-the-counter and prescription medicines only as told by your health care provider. Use an inhaler, vaporizer, or humidifier as told by your health care provider. Take two teaspoons (10 mL) of honey at bedtime to lessen coughing at night. Drink enough fluid to keep your urine pale yellow. Do not use any products that contain nicotine or tobacco. These products include cigarettes, chewing tobacco, and vaping devices, such as e-cigarettes. If you need help quitting, ask your health care provider. Get plenty of rest. Return to your normal activities as told by your health care provider. Ask  your health care provider what activities are safe for you. Keep all follow-up visits. This is important. How is this prevented? To lower your risk of getting this condition again: Wash your hands often with soap and water for at least 20 seconds. If soap and water are not available, use hand sanitizer. Avoid contact with people who have cold symptoms. Try not to touch your mouth, nose, or eyes with your hands. Avoid breathing in smoke or chemical fumes. Breathing smoke or chemical fumes will make your condition worse. Get the flu shot every year. Contact a health care provider if: Your symptoms do not improve after 2 weeks. You have trouble coughing up the mucus. Your cough keeps you awake at night. You have a fever. Get help right away if you: Cough up blood. Feel pain in your chest. Have severe shortness of breath. Faint or keep feeling like you are going to faint. Have a  severe headache. Have a fever or chills that get worse. These symptoms may represent a serious problem that is an emergency. Do not wait to see if the symptoms will go away. Get medical help right away. Call your local emergency services (911 in the [place] Do not drive yourself to the hospital. Summary Acute bronchitis is inflammation of the main airways (bronchi) that come off the windpipe (trachea) in the lungs. The swelling causes the airways to get smaller and make more mucus than normal. Drinking more fluids can help thin your mucus so it is easier to cough up. Take over-the-counter and prescription medicines only as told by your health care provider. Do not use any products that contain nicotine or tobacco. These products include cigarettes, chewing tobacco, and vaping devices, such as e-cigarettes. If you need help quitting, ask your health care provider. Contact a health care provider if your symptoms do not improve after 2 weeks. This information is not intended to replace advice given to you by your health  care provider. Make sure you discuss any questions you have with your health care provider. Document Revised: 01/25/2021 Document Reviewed: 01/25/2021 Elsevier Patient Education  2022 [employer].    If you have been instructed to have an in-person evaluation today at a local Urgent Care facility, please use the link below. It will take you to a list of all of our available [facility], including address, phone number and hours of operation. Please do not delay care.  [facility]  If you or a family member do not have a primary care provider, use the link below to schedule a visit and establish care. When you choose a [facility] primary care physician or advanced practice provider, you gain a long-term partner in health. Find a Primary Care Provider  Learn more about [facility]'s in-office and virtual care options: [facility]

## 2022-02-02 ENCOUNTER — Ambulatory Visit (INDEPENDENT_AMBULATORY_CARE_PROVIDER_SITE_OTHER): Payer: Medicare Other | Admitting: Orthopedic Surgery

## 2022-02-02 DIAGNOSIS — M65342 Trigger finger, left ring finger: Secondary | ICD-10-CM | POA: Diagnosis not present

## 2022-02-02 MED ORDER — BETAMETHASONE SOD PHOS & ACET 6 (3-3) MG/ML IJ SUSP
6.0000 mg | INTRAMUSCULAR | Status: AC | PRN
  Administered 2022-02-02: 6 mg via INTRA_ARTICULAR

## 2022-02-02 MED ORDER — LIDOCAINE HCL 1 % IJ SOLN
1.0000 mL | INTRAMUSCULAR | Status: AC | PRN
  Administered 2022-02-02: 1 mL

## 2022-02-02 NOTE — Progress Notes (Signed)
? ?Office Visit Note ?  ?Patient: Maria Aguirre           ?Date of Birth: Sep 09, 1956           ?MRN: [id number] ?Visit Date: 02/02/2022 ?             ?Requested by: Ria Bush, MD ?[facility] ?[place],  [place] [address] ?PCP: Ria Bush, MD ? ? ?Assessment & Plan: ?Visit Diagnoses:  ?1. Trigger finger, left ring finger   ? ? ?Plan: We discussed the nature of trigger finger as well as its diagnosis, prognosis, and both surgical and conservative treatment options.  After our discussion, the patient like to proceed with corticosteroid injection.  We reviewed the risks of steroid injection.  Patient would like to proceed.  I can see her back in 6 to 8 weeks if she still symptomatic. ? ?Follow-Up Instructions: No follow-ups on file.  ? ?Orders:  ?No orders of the defined types were placed in this encounter. ? ?No orders of the defined types were placed in this encounter. ? ? ? ? Procedures: ?Hand/UE Inj: L ring A1 for trigger finger on 02/02/2022 1:07 PM ?Indications: tendon swelling and therapeutic ?Details: 25 G needle, volar approach ?Medications: 1 mL lidocaine 1 %; 6 mg betamethasone acetate-betamethasone sodium phosphate 6 (3-3) MG/ML ?Procedure, treatment alternatives, risks and benefits explained, specific risks discussed. Consent was given by the patient. Immediately prior to procedure a time out was called to verify the correct patient, procedure, equipment, support staff and site/side marked as required. Patient was prepped and draped in the usual sterile fashion.  ? ? ? ? ?Clinical Data: ?No additional findings. ? ? ?Subjective: ?Chief Complaint  ?Patient presents with  ? Left Ring Finger - Pain  ? ? ?This is a 66 year old left-hand-dominant female who presents with painful locking and catching of the left ring finger.  This been going on for several months now.  On Sunday morning she woke up with the finger stuck in a flexed position.  She was unable to unlock the finger on her own and  has been with her ring finger stuck flexed since that time.  She denies any involvement of other fingers.  She denies any previous treatment for this issue. ? ? ?Review of Systems ? ? ?Objective: ?Vital Signs: There were no vitals taken for this visit. ? ?Physical Exam ?Constitutional:   ?   Appearance: Normal appearance.  ?Cardiovascular:  ?   Rate and Rhythm: Normal rate.  ?   Pulses: Normal pulses.  ?Pulmonary:  ?   Effort: Pulmonary effort is normal.  ?Skin: ?   General: Skin is warm and dry.  ?   Capillary Refill: Capillary refill takes less than 2 seconds.  ?Neurological:  ?   Mental Status: She is alert.  ? ? ?Left Hand Exam  ? ?Tenderness  ?Left hand tenderness location: TTP at ring finger A1 pulley.  ? ?Other  ?Erythema: absent ?Sensation: normal ?Pulse: present ? ?Comments:  Ring finger held in flexed position.  Unlocked with gentle passive extension and painful clunk.  Palpable nodule over A1 pulley.  ? ? ? ? ?Specialty Comments:  ?No specialty comments available. ? ?Imaging: ?No results found. ? ? ?PMFS History: ?Patient Active Problem List  ? Diagnosis Date Noted  ? Trigger finger, left ring finger 02/02/2022  ? Osteopenia 02/22/2020  ? Migraine with aura 01/28/2020  ? Welcome to Medicare preventive visit 12/08/2019  ? Advanced care planning/counseling discussion 12/08/2019  ?  Lymphadenopathy, supraclavicular 12/08/2019  ? Periodontal disease 12/08/2019  ? Acquired renal cyst of right kidney 09/03/2017  ? Rosacea 09/03/2017  ? Health maintenance examination 01/10/2015  ? Cerebral aneurysm   ? Anxiety state 01/24/2009  ? CARPAL TUNNEL SYNDROME, BILATERAL 01/24/2009  ? Vitamin D deficiency 07/05/2008  ? HYPERCHOLESTEROLEMIA 07/05/2008  ? GERD 07/05/2008  ? OVARIAN CYST 3CM RIGHT 07/05/2008  ? ?Past Medical History:  ?Diagnosis Date  ? Allergy   ? Cerebral aneurysm remote  ? ?of this - told by neurologist, was told at age 39  ? GERD (gastroesophageal reflux disease)   ? PRN  ? History of migraine with aura    ? "no migraine in a long time"  ? Migraine with aura 01/28/2020  ? Rosacea   ? dairy flares up rosacea  ? Seasonal allergies   ?  ?Family History  ?Problem Relation Age of Onset  ? CAD Father   ?     stents  ? Colon cancer Maternal Grandmother 23  ? CAD Paternal Grandmother   ? Heart failure Paternal Grandmother   ? Parkinson's disease Maternal Grandfather   ? Thyroid disease Mother   ? High Cholesterol Mother   ? Heart disease Sister 26  ?     pacemaker  ? CAD Paternal Uncle   ?     MI  ? Diabetes Neg Hx   ? Esophageal cancer Neg Hx   ? Stomach cancer Neg Hx   ? Rectal cancer Neg Hx   ? Colon polyps Neg Hx   ?  ?Past Surgical History:  ?Procedure Laterality Date  ? APPENDECTOMY  2004  ? BUNIONECTOMY Bilateral   ? CARPAL TUNNEL RELEASE Bilateral   ? COLONOSCOPY  10/2017  ? TAs, diverticulosis, rpt 3 yrs ([facility])  ? TUBAL LIGATION    ? ?Social History  ? ?Occupational History  ? Not on file  ?Tobacco Use  ? Smoking status: Never  ? Smokeless tobacco: Never  ?Vaping Use  ? Vaping Use: Never used  ?Substance and Sexual Activity  ? Alcohol use: Yes  ?  Comment: occasionally bi-weekly beer per pt  ? Drug use: No  ? Sexual activity: Yes  ?  Birth control/protection: None  ? ? ? ? ? ? ?

## 2022-05-25 ENCOUNTER — Ambulatory Visit: Payer: Medicare Other | Admitting: Orthopedic Surgery

## 2022-06-27 ENCOUNTER — Encounter: Payer: Self-pay | Admitting: Orthopaedic Surgery

## 2022-06-27 ENCOUNTER — Ambulatory Visit (INDEPENDENT_AMBULATORY_CARE_PROVIDER_SITE_OTHER): Payer: Medicare Other | Admitting: Orthopaedic Surgery

## 2022-06-27 DIAGNOSIS — M65342 Trigger finger, left ring finger: Secondary | ICD-10-CM | POA: Diagnosis not present

## 2022-06-27 MED ORDER — METHYLPREDNISOLONE ACETATE 40 MG/ML IJ SUSP
13.3300 mg | INTRAMUSCULAR | Status: AC | PRN
  Administered 2022-06-27: 13.33 mg

## 2022-06-27 MED ORDER — LIDOCAINE HCL 1 % IJ SOLN
0.3000 mL | INTRAMUSCULAR | Status: AC | PRN
  Administered 2022-06-27: .3 mL

## 2022-06-27 MED ORDER — BUPIVACAINE HCL 0.5 % IJ SOLN
0.3300 mL | INTRAMUSCULAR | Status: AC | PRN
  Administered 2022-06-27: .33 mL

## 2022-06-27 NOTE — Progress Notes (Signed)
Office Visit Note   Patient: Maria Aguirre           Date of Birth: September 16, 1956           MRN: [id number] Visit Date: 06/27/2022              Requested by: Ria Bush, MD [address] [place],  [place] [address] PCP: Ria Bush, MD   Assessment & Plan: Visit Diagnoses:  1. Trigger finger, left ring finger     Plan: [name] returns today for recurrent left ring trigger finger.  Last injection was in April by Dr. Tempie Donning.  She works at [employer] and does a lot with her hands and she feels that this has aggravated the trigger finger.  Has been wearing a finger splint which helps decreased movement.  Examination of the left ring finger is consistent with a recurrent trigger finger.  Based on options we move forward with another trigger finger injection today.  She tolerated this well.  We will see her back as needed.  Follow-Up Instructions: No follow-ups on file.   Orders:  No orders of the defined types were placed in this encounter.  No orders of the defined types were placed in this encounter.     Procedures: Hand/UE Inj: L ring A1 for trigger finger on 06/27/2022 9:10 AM Indications: pain Details: 25 G needle Medications: 0.3 mL lidocaine 1 %; 0.33 mL bupivacaine 0.5 %; 13.33 mg methylPREDNISolone acetate 40 MG/ML Outcome: tolerated well, no immediate complications Consent was given by the patient. Patient was prepped and draped in the usual sterile fashion.       Clinical Data: No additional findings.   Subjective: Chief Complaint  Patient presents with   Left Hand - Pain    Trigger ring finger    HPI  Review of Systems   Objective: Vital Signs: There were no vitals taken for this visit.  Physical Exam  Ortho Exam  Specialty Comments:  No specialty comments available.  Imaging: No results found.   PMFS History: Patient Active Problem List   Diagnosis Date Noted   Trigger finger, left ring finger 02/02/2022    Osteopenia 02/22/2020   Migraine with aura 01/28/2020   Welcome to Medicare preventive visit 12/08/2019   Advanced care planning/counseling discussion 12/08/2019   Lymphadenopathy, supraclavicular 12/08/2019   Periodontal disease 12/08/2019   Acquired renal cyst of right kidney 09/03/2017   Rosacea 09/03/2017   Health maintenance examination 01/10/2015   Cerebral aneurysm    Anxiety state 01/24/2009   CARPAL TUNNEL SYNDROME, BILATERAL 01/24/2009   Vitamin D deficiency 07/05/2008   HYPERCHOLESTEROLEMIA 07/05/2008   GERD 07/05/2008   OVARIAN CYST 3CM RIGHT 07/05/2008   Past Medical History:  Diagnosis Date   Allergy    Cerebral aneurysm remote   ?of this - told by neurologist, was told at age 44   GERD (gastroesophageal reflux disease)    PRN   History of migraine with aura    "no migraine in a long time"   Migraine with aura 01/28/2020   Rosacea    dairy flares up rosacea   Seasonal allergies     Family History  Problem Relation Age of Onset   CAD Father        stents   Colon cancer Maternal Grandmother 25   CAD Paternal Grandmother    Heart failure Paternal Grandmother    Parkinson's disease Maternal Grandfather    Thyroid disease Mother    High Cholesterol  Mother    Heart disease Sister 61       pacemaker   CAD Paternal Uncle        MI   Diabetes Neg Hx    Esophageal cancer Neg Hx    Stomach cancer Neg Hx    Rectal cancer Neg Hx    Colon polyps Neg Hx     Past Surgical History:  Procedure Laterality Date   APPENDECTOMY  2004   BUNIONECTOMY Bilateral    CARPAL TUNNEL RELEASE Bilateral    COLONOSCOPY  10/2017   TAs, diverticulosis, rpt 3 yrs ([facility])   TUBAL LIGATION     Social History   Occupational History   Not on file  Tobacco Use   Smoking status: Never   Smokeless tobacco: Never  Vaping Use   Vaping Use: Never used  Substance and Sexual Activity   Alcohol use: Yes    Comment: occasionally bi-weekly beer per pt   Drug use: No   Sexual  activity: Yes    Birth control/protection: None

## 2022-07-02 ENCOUNTER — Ambulatory Visit (INDEPENDENT_AMBULATORY_CARE_PROVIDER_SITE_OTHER): Payer: Medicare Other

## 2022-07-02 VITALS — Wt 181.0 lb

## 2022-07-02 DIAGNOSIS — Z1231 Encounter for screening mammogram for malignant neoplasm of breast: Secondary | ICD-10-CM | POA: Diagnosis not present

## 2022-07-02 DIAGNOSIS — Z1211 Encounter for screening for malignant neoplasm of colon: Secondary | ICD-10-CM

## 2022-07-02 DIAGNOSIS — E669 Obesity, unspecified: Secondary | ICD-10-CM | POA: Insufficient documentation

## 2022-07-02 DIAGNOSIS — Z0000 Encounter for general adult medical examination without abnormal findings: Secondary | ICD-10-CM | POA: Diagnosis not present

## 2022-07-02 NOTE — Patient Instructions (Signed)
Maria Aguirre , Thank you for taking time to come for your Medicare Wellness Visit. I appreciate your ongoing commitment to your health goals. Please review the following plan we discussed and let me know if I can assist you in the future.   Screening recommendations/referrals: Colonoscopy: 10/22/17, referral sent Mammogram: 02/15/20, referral sent Bone Density: 02/22/20 Recommended yearly ophthalmology/optometry visit for glaucoma screening and checkup Recommended yearly dental visit for hygiene and checkup  Vaccinations: Influenza vaccine: n/d Pneumococcal vaccine: n/d Tdap vaccine: pt states had shot May 2021 Shingles vaccine: n/d   Covid-19:12/04/19, 12/29/19  Advanced directives: no  Conditions/risks identified: none  Next appointment: Follow up in one year for your annual wellness visit 07/04/23 @ 9 am by phone   Preventive Care 65 Years and Older, Female Preventive care refers to lifestyle choices and visits with your health care provider that can promote health and wellness. What does preventive care include? A yearly physical exam. This is also called an annual well check. Dental exams once or twice a year. Routine eye exams. Ask your health care provider how often you should have your eyes checked. Personal lifestyle choices, including: Daily care of your teeth and gums. Regular physical activity. Eating a healthy diet. Avoiding tobacco and drug use. Limiting alcohol use. Practicing safe sex. Taking low-dose aspirin every day. Taking vitamin and mineral supplements as recommended by your health care provider. What happens during an annual well check? The services and screenings done by your health care provider during your annual well check will depend on your age, overall health, lifestyle risk factors, and family history of disease. Counseling  Your health care provider may ask you questions about your: Alcohol use. Tobacco use. Drug use. Emotional well-being. Home  and relationship well-being. Sexual activity. Eating habits. History of falls. Memory and ability to understand (cognition). Work and work Statistician. Reproductive health. Screening  You may have the following tests or measurements: Height, weight, and BMI. Blood pressure. Lipid and cholesterol levels. These may be checked every 5 years, or more frequently if you are over 3 years old. Skin check. Lung cancer screening. You may have this screening every year starting at age 42 if you have a 30-pack-year history of smoking and currently smoke or have quit within the past 15 years. Fecal occult blood test (FOBT) of the stool. You may have this test every year starting at age 49. Flexible sigmoidoscopy or colonoscopy. You may have a sigmoidoscopy every 5 years or a colonoscopy every 10 years starting at age 64. Hepatitis C blood test. Hepatitis B blood test. Sexually transmitted disease (STD) testing. Diabetes screening. This is done by checking your blood sugar (glucose) after you have not eaten for a while (fasting). You may have this done every 1-3 years. Bone density scan. This is done to screen for osteoporosis. You may have this done starting at age 58. Mammogram. This may be done every 1-2 years. Talk to your health care provider about how often you should have regular mammograms. Talk with your health care provider about your test results, treatment options, and if necessary, the need for more tests. Vaccines  Your health care provider may recommend certain vaccines, such as: Influenza vaccine. This is recommended every year. Tetanus, diphtheria, and acellular pertussis (Tdap, Td) vaccine. You may need a Td booster every 10 years. Zoster vaccine. You may need this after age 84. Pneumococcal 13-valent conjugate (PCV13) vaccine. One dose is recommended after age 7. Pneumococcal polysaccharide (PPSV23) vaccine. One dose is recommended  after age 39. Talk to your health care provider  about which screenings and vaccines you need and how often you need them. This information is not intended to replace advice given to you by your health care provider. Make sure you discuss any questions you have with your health care provider. Document Released: 10/21/2015 Document Revised: 06/13/2016 Document Reviewed: 07/26/2015 Elsevier Interactive Patient Education  2017 [place] Prevention in the Home Falls can cause injuries. They can happen to people of all ages. There are many things you can do to make your home safe and to help prevent falls. What can I do on the outside of my home? Regularly fix the edges of walkways and driveways and fix any cracks. Remove anything that might make you trip as you walk through a door, such as a raised step or threshold. Trim any bushes or trees on the path to your home. Use bright outdoor lighting. Clear any walking paths of anything that might make someone trip, such as rocks or tools. Regularly check to see if handrails are loose or broken. Make sure that both sides of any steps have handrails. Any raised decks and porches should have guardrails on the edges. Have any leaves, snow, or ice cleared regularly. Use sand or salt on walking paths during winter. Clean up any spills in your garage right away. This includes oil or grease spills. What can I do in the bathroom? Use night lights. Install grab bars by the toilet and in the tub and shower. Do not use towel bars as grab bars. Use non-skid mats or decals in the tub or shower. If you need to sit down in the shower, use a plastic, non-slip stool. Keep the floor dry. Clean up any water that spills on the floor as soon as it happens. Remove soap buildup in the tub or shower regularly. Attach bath mats securely with double-sided non-slip rug tape. Do not have throw rugs and other things on the floor that can make you trip. What can I do in the bedroom? Use night lights. Make sure  that you have a light by your bed that is easy to reach. Do not use any sheets or blankets that are too big for your bed. They should not hang down onto the floor. Have a firm chair that has side arms. You can use this for support while you get dressed. Do not have throw rugs and other things on the floor that can make you trip. What can I do in the kitchen? Clean up any spills right away. Avoid walking on wet floors. Keep items that you use a lot in easy-to-reach places. If you need to reach something above you, use a strong step stool that has a grab bar. Keep electrical cords out of the way. Do not use floor polish or wax that makes floors slippery. If you must use wax, use non-skid floor wax. Do not have throw rugs and other things on the floor that can make you trip. What can I do with my stairs? Do not leave any items on the stairs. Make sure that there are handrails on both sides of the stairs and use them. Fix handrails that are broken or loose. Make sure that handrails are as long as the stairways. Check any carpeting to make sure that it is firmly attached to the stairs. Fix any carpet that is loose or worn. Avoid having throw rugs at the top or bottom of the stairs. If you  do have throw rugs, attach them to the floor with carpet tape. Make sure that you have a light switch at the top of the stairs and the bottom of the stairs. If you do not have them, ask someone to add them for you. What else can I do to help prevent falls? Wear shoes that: Do not have high heels. Have rubber bottoms. Are comfortable and fit you well. Are closed at the toe. Do not wear sandals. If you use a stepladder: Make sure that it is fully opened. Do not climb a closed stepladder. Make sure that both sides of the stepladder are locked into place. Ask someone to hold it for you, if possible. Clearly mark and make sure that you can see: Any grab bars or handrails. First and last steps. Where the edge of  each step is. Use tools that help you move around (mobility aids) if they are needed. These include: Canes. Walkers. Scooters. Crutches. Turn on the lights when you go into a dark area. Replace any light bulbs as soon as they burn out. Set up your furniture so you have a clear path. Avoid moving your furniture around. If any of your floors are uneven, fix them. If there are any pets around you, be aware of where they are. Review your medicines with your doctor. Some medicines can make you feel dizzy. This can increase your chance of falling. Ask your doctor what other things that you can do to help prevent falls. This information is not intended to replace advice given to you by your health care provider. Make sure you discuss any questions you have with your health care provider. Document Released: 07/21/2009 Document Revised: 03/01/2016 Document Reviewed: 10/29/2014 Elsevier Interactive Patient Education  2017 [employer].

## 2022-07-02 NOTE — Progress Notes (Signed)
Virtual Visit via Telephone Note  I connected with  Maria Aguirre on 07/02/22 at  9:45 AM EDT by telephone and verified that I am speaking with the correct person using two identifiers.  Location: Patient: home Provider: [place] Persons participating in the virtual visit: [place]   I discussed the limitations, risks, security and privacy concerns of performing an evaluation and management service by telephone and the availability of in person appointments. The patient expressed understanding and agreed to proceed.  Interactive audio and video telecommunications were attempted between this nurse and patient, however failed, due to patient having technical difficulties OR patient did not have access to video capability.  We continued and completed visit with audio only.  Some vital signs may be absent or patient reported.   Dionisio David, LPN  Subjective:   Maria Aguirre is a 66 y.o. female who presents for Medicare Annual (Subsequent) preventive examination.  Review of Systems     Cardiac Risk Factors include: advanced age (>41mn, >>60women)     Objective:    There were no vitals filed for this visit. There is no height or weight on file to calculate BMI.     07/02/2022    9:53 AM 04/25/2015    8:02 AM  Advanced Directives  Does Patient Have a Medical Advance Directive? No No  Would patient like information on creating a medical advance directive? No - Patient declined     Current Medications (verified) Outpatient Encounter Medications as of 07/02/2022  Medication Sig   ASPIRIN 81 PO Take 1 tablet by mouth daily.   B COMPLEX VITAMINS PO Take by mouth.   Calcium Carbonate 260 MG CHEW Chew 2 tablets by mouth 2 (two) times daily.   cholecalciferol (VITAMIN D) 1000 UNITS tablet Take 1,000 Units by mouth 2 (two) times daily.   diphenhydrAMINE (BENADRYL) 25 mg capsule Take 25 mg by mouth at bedtime. As needed   diphenhydramine-acetaminophen  (TYLENOL PM) 25-500 MG TABS tablet Take 1 tablet by mouth at bedtime as needed.   vitamin C (ASCORBIC ACID) 500 MG tablet Take 500 mg by mouth 2 (two) times daily.   [DISCONTINUED] benzonatate (TESSALON) 100 MG capsule Take 1 capsule (100 mg total) by mouth 3 (three) times daily as needed.   No facility-administered encounter medications on file as of 07/02/2022.    Allergies (verified) Shellfish allergy, Poison ivy extract, and Tilactase   History: Past Medical History:  Diagnosis Date   Allergy    Cerebral aneurysm remote   ?of this - told by neurologist, was told at age 66  GERD (gastroesophageal reflux disease)    PRN   History of migraine with aura    "no migraine in a long time"   Migraine with aura 01/28/2020   Rosacea    dairy flares up rosacea   Seasonal allergies    Past Surgical History:  Procedure Laterality Date   APPENDECTOMY  2004   BUNIONECTOMY Bilateral    CARPAL TUNNEL RELEASE Bilateral    COLONOSCOPY  10/2017   TAs, diverticulosis, rpt 3 yrs ([facility])   TUBAL LIGATION     Family History  Problem Relation Age of Onset   CAD Father        stents   Colon cancer Maternal Grandmother [id number]  CAD Paternal Grandmother    Heart failure Paternal Grandmother    Parkinson's disease Maternal Grandfather    Thyroid disease Mother    High Cholesterol Mother  Heart disease Sister 5       pacemaker   CAD Paternal Uncle        MI   Diabetes Neg Hx    Esophageal cancer Neg Hx    Stomach cancer Neg Hx    Rectal cancer Neg Hx    Colon polyps Neg Hx    Social History   Socioeconomic History   Marital status: Married    Spouse name: Not on file   Number of children: Not on file   Years of education: Not on file   Highest education level: Not on file  Occupational History   Not on file  Tobacco Use   Smoking status: Never   Smokeless tobacco: Never  Vaping Use   Vaping Use: Never used  Substance and Sexual Activity   Alcohol use: Yes    Comment:  occasionally bi-weekly beer per pt   Drug use: No   Sexual activity: Yes    Birth control/protection: None  Other Topics Concern   Not on file  Social History Narrative   Lives with husband Hospital doctor) and mother, dog   Occupation: retired from post office, working full time at [employer]   Activity: walking at work, [employer], Haematologist   Diet: good water, fruits/vegetables daily   Social Determinants of Health   Financial Resource Strain: [place]  (07/02/2022)   Overall Financial Resource Strain (CARDIA)    Difficulty of Paying Living Expenses: Not hard at all  Food Insecurity: No Food Insecurity (07/02/2022)   Hunger Vital Sign    Worried About Running Out of Food in the Last Year: Never true    [place] in the Last Year: Never true  Transportation Needs: No Transportation Needs (07/02/2022)   PRAPARE - Hydrologist (Medical): No    Lack of Transportation (Non-Medical): No  Physical Activity: Sufficiently Active (07/02/2022)   Exercise Vital Sign    Days of Exercise per Week: 4 days    Minutes of Exercise per Session: 40 min  Stress: No Stress Concern Present (07/02/2022)   [place]    Feeling of Stress : Only a little  Social Connections: Moderately Isolated (07/02/2022)   Social Connection and Isolation Panel [NHANES]    Frequency of Communication with Friends and Family: More than three times a week    Frequency of Social Gatherings with Friends and Family: More than three times a week    Attends Religious Services: Never    Marine scientist or Organizations: No    Attends Music therapist: Never    Marital Status: Married    Tobacco Counseling Counseling given: Not Answered   Clinical Intake:  Pre-visit preparation completed: Yes  Pain : No/denies pain     Diabetes: No  How often do you need to have someone help you when you read  instructions, pamphlets, or other written materials from your doctor or pharmacy?: 1 - Never  Diabetic?no  Interpreter Needed?: No  Information entered by :: Kirke Shaggy, LPN   Activities of Daily Living    07/02/2022    9:56 AM 06/28/2022    8:44 AM  In your present state of health, do you have any difficulty performing the following activities:  Hearing? 0 0  Vision? 0 0  Difficulty concentrating or making decisions? 0 0  Walking or climbing stairs? 0 0  Dressing or bathing? 0 0  Doing errands,  shopping? 0 0  Preparing Food and eating ? N N  Using the Toilet? N N  In the past six months, have you accidently leaked urine? N N  Do you have problems with loss of bowel control? N N  Managing your Medications? N N  Managing your Finances? N N  Housekeeping or managing your Housekeeping? N N    Patient Care Team: Ria Bush, MD as PCP - General (Family Medicine)  Indicate any recent Medical Services you may have received from other than [facility] providers in the past year (date may be approximate).     Assessment:   This is a routine wellness examination for Amandamarie.  Hearing/Vision screen Hearing Screening - Comments:: No aids Vision Screening - Comments:: Wears glasses-  Eye  Dietary issues and exercise activities discussed: Current Exercise Habits: Home exercise routine, Type of exercise: walking, Time (Minutes): 40, Frequency (Times/Week): 4, Weekly Exercise (Minutes/Week): 160   Goals Addressed             This Visit's Progress    DIET - EAT MORE FRUITS AND VEGETABLES         Depression Screen    07/02/2022    9:50 AM 06/17/2021    9:22 AM 12/08/2019    4:18 PM 09/03/2017   11:24 AM  PHQ 2/9 Scores  PHQ - 2 Score 2 2 0 1  PHQ- 9 Score  3      Fall Risk    07/02/2022    9:54 AM 06/28/2022    8:44 AM 06/17/2021    9:21 AM 12/08/2019    4:16 PM  Fall Risk   Falls in the past year? 1 0 1 0  Number falls in past yr: 0  0   Injury with Fall? 1   0   Risk for fall due to : No Fall Risks  Impaired mobility   Follow up Falls prevention discussed;Falls evaluation completed       FALL RISK PREVENTION PERTAINING TO THE HOME:  Any stairs in or around the home? No  If so, are there any without handrails? No  Home free of loose throw rugs in walkways, pet beds, electrical cords, etc? Yes  Adequate lighting in your home to reduce risk of falls? Yes   ASSISTIVE DEVICES UTILIZED TO PREVENT FALLS:  Life alert? No  Use of a cane, walker or w/c? No  Grab bars in the bathroom? No  Shower chair or bench in shower? Yes  Elevated toilet seat or a handicapped toilet? No    Cognitive Function:        07/02/2022    9:57 AM 06/17/2021    9:31 AM  6CIT Screen  What Year? 0 points 0 points  What month? 0 points 0 points  What time? 0 points 0 points  Count back from 20 0 points 0 points  Months in reverse 0 points 0 points  Repeat phrase 0 points 0 points  Total Score 0 points 0 points    Immunizations Immunization History  Administered Date(s) Administered   Influenza Whole 06/30/2008, 08/23/2009   Influenza-Unspecified 07/02/2013, 07/08/2014, 08/20/2017, 08/10/2019   PFIZER(Purple Top)SARS-COV-2 Vaccination 12/04/2019, 12/29/2019   Td 05/31/2009    TDAP status: Due, Education has been provided regarding the importance of this vaccine. Advised may receive this vaccine at local pharmacy or Health Dept. Aware to provide a copy of the vaccination record if obtained from local pharmacy or Health Dept. Verbalized acceptance and understanding.  Flu Vaccine status:  Due, Education has been provided regarding the importance of this vaccine. Advised may receive this vaccine at local pharmacy or Health Dept. Aware to provide a copy of the vaccination record if obtained from local pharmacy or Health Dept. Verbalized acceptance and understanding.  Pneumococcal vaccine status: Due, Education has been provided regarding the importance of this  vaccine. Advised may receive this vaccine at local pharmacy or Health Dept. Aware to provide a copy of the vaccination record if obtained from local pharmacy or Health Dept. Verbalized acceptance and understanding.  Covid-19 vaccine status: Completed vaccines  Qualifies for Shingles Vaccine? Yes   Zostavax completed No   Shingrix Completed?: Yes  Screening Tests Health Maintenance  Topic Date Due   Zoster Vaccines- Shingrix (1 of 2) Never done   TETANUS/TDAP  06/01/2019   Pneumonia Vaccine 53+ Years old (1 - PCV) Never done   COVID-19 Vaccine (3 - Pfizer series) 02/23/2020   COLONOSCOPY (Pts 45-41yr Insurance coverage will need to be confirmed)  10/22/2020   MAMMOGRAM  02/14/2022   INFLUENZA VACCINE  05/08/2022   DEXA SCAN  Completed   Hepatitis C Screening  Completed   HPV VACCINES  Aged Out    Health Maintenance  Health Maintenance Due  Topic Date Due   Zoster Vaccines- Shingrix (1 of 2) Never done   TETANUS/TDAP  06/01/2019   Pneumonia Vaccine 68 Years old (1 - PCV) Never done   COVID-19 Vaccine (3 - Pfizer series) 02/23/2020   COLONOSCOPY (Pts 45-4107yrInsurance coverage will need to be confirmed)  10/22/2020   MAMMOGRAM  02/14/2022   INFLUENZA VACCINE  05/08/2022    Colorectal cancer screening: Type of screening: Colonoscopy. Completed 10/22/17. Repeat every 3 years- referral sent  Mammogram status: Completed 02/15/20. Repeat every year- referral sent  Bone Density status: Completed 02/22/20. Results reflect: Bone density results: OSTEOPENIA. Repeat every 5 years.  Lung Cancer Screening: (Low Dose CT Chest recommended if Age 547-80ears, 30 pack-year currently smoking OR have quit w/in 15years.) does not qualify.   Additional Screening:  Hepatitis C Screening: does qualify; Completed 11/17/19  Vision Screening: Recommended annual ophthalmology exams for early detection of glaucoma and other disorders of the eye. Is the patient up to date with their annual eye exam?   Yes  Who is the provider or what is the name of the office in which the patient attends annual eye exams? [facility] is not established with a provider, would they like to be referred to a provider to establish care? No .   Dental Screening: Recommended annual dental exams for proper oral hygiene  Community Resource Referral / Chronic Care Management: CRR required this visit?  No   CCM required this visit?  No      Plan:     I have personally reviewed and noted the following in the patient's chart:   Medical and social history Use of alcohol, tobacco or illicit drugs  Current medications and supplements including opioid prescriptions. Patient is not currently taking opioid prescriptions. Functional ability and status Nutritional status Physical activity Advanced directives List of other physicians Hospitalizations, surgeries, and ER visits in previous 12 months Vitals Screenings to include cognitive, depression, and falls Referrals and appointments  In addition, I have reviewed and discussed with patient certain preventive protocols, quality metrics, and best practice recommendations. A written personalized care plan for preventive services as well as general preventive health recommendations were provided to patient.     LoDionisio DavidLPN  07/02/2022   Nurse Notes: none

## 2022-08-12 ENCOUNTER — Encounter: Payer: Self-pay | Admitting: Family Medicine

## 2022-08-13 NOTE — Telephone Encounter (Signed)
Updated pt's chart.  

## 2022-09-07 ENCOUNTER — Telehealth: Payer: Medicare Other | Admitting: Family Medicine

## 2022-09-07 DIAGNOSIS — R030 Elevated blood-pressure reading, without diagnosis of hypertension: Secondary | ICD-10-CM | POA: Diagnosis not present

## 2022-09-07 NOTE — Progress Notes (Signed)
Virtual Visit Consent   Maria Aguirre, you are scheduled for a virtual visit with a [facility] provider today. Just as with appointments in the office, your consent must be obtained to participate. Your consent will be active for this visit and any virtual visit you may have with one of our providers in the next 365 days. If you have a MyChart account, a copy of this consent can be sent to you electronically.  As this is a virtual visit, video technology does not allow for your provider to perform a traditional examination. This may limit your provider's ability to fully assess your condition. If your provider identifies any concerns that need to be evaluated in person or the need to arrange testing (such as labs, EKG, etc.), we will make arrangements to do so. Although advances in technology are sophisticated, we cannot ensure that it will always work on either your end or our end. If the connection with a video visit is poor, the visit may have to be switched to a telephone visit. With either a video or telephone visit, we are not always able to ensure that we have a secure connection.  By engaging in this virtual visit, you consent to the provision of healthcare and authorize for your insurance to be billed (if applicable) for the services provided during this visit. Depending on your insurance coverage, you may receive a charge related to this service.  I need to obtain your verbal consent now. Are you willing to proceed with your visit today? KAYLEANNA LORMAN has provided verbal consent on 09/07/2022 for a virtual visit (video or telephone). Dellia Nims, FNP  Date: 09/07/2022 7:38 PM  Virtual Visit via Video Note   I, Dellia Nims, connected with  Maria Aguirre  ([id number], [birth year]) on 09/07/22 at  7:15 PM EST by a video-enabled telemedicine application and verified that I am speaking with the correct person using two identifiers.  Location: Patient: Virtual Visit Location Patient:  Home Provider: Virtual Visit Location Provider: Home Office   I discussed the limitations of evaluation and management by telemedicine and the availability of in person appointments. The patient expressed understanding and agreed to proceed.    History of Present Illness: Maria Aguirre is a 66 y.o. who identifies as a female who was assigned female at birth, and is being seen today for elevated BP of 127/91. She reports no history of HTN. She looked it up on google and became concerned. She is not on any BP meds. She denies SOB, chest pain, dizziness and nausea. She drank and coffee drink this afternoon with an expresso shot also  HPI  Problems:  Patient Active Problem List   Diagnosis Date Noted   Obesity 07/02/2022   Trigger finger, left ring finger 02/02/2022   Sprain of left foot 03/16/2020   Osteopenia 02/22/2020   Migraine with aura 01/28/2020   Welcome to Medicare preventive visit 12/08/2019   Advanced care planning/counseling discussion 12/08/2019   Lymphadenopathy, supraclavicular 12/08/2019   Periodontal disease 99/35/7017   Renal colic 79/39/0300   Complex renal cyst 10/31/2017   Acquired renal cyst of right kidney 09/03/2017   Rosacea 09/03/2017   Health maintenance examination 01/10/2015   Anxiety state 01/24/2009   CARPAL TUNNEL SYNDROME, BILATERAL 01/24/2009   Vitamin D deficiency 07/05/2008   HYPERCHOLESTEROLEMIA 07/05/2008   GERD 07/05/2008   OVARIAN CYST 3CM RIGHT 07/05/2008    Allergies:  Allergies  Allergen Reactions   Shellfish Allergy Hives   Poison  Ivy Extract    Tilactase Rash    Face   Medications:  Current Outpatient Medications:    ASPIRIN 81 PO, Take 1 tablet by mouth daily., Disp: , Rfl:    B COMPLEX VITAMINS PO, Take by mouth., Disp: , Rfl:    Calcium Carbonate 260 MG CHEW, Chew 2 tablets by mouth 2 (two) times daily., Disp: , Rfl:    cholecalciferol (VITAMIN D) 1000 UNITS tablet, Take 1,000 Units by mouth 2 (two) times daily., Disp: , Rfl:     diphenhydrAMINE (BENADRYL) 25 mg capsule, Take 25 mg by mouth at bedtime. As needed, Disp: , Rfl:    diphenhydramine-acetaminophen (TYLENOL PM) 25-500 MG TABS tablet, Take 1 tablet by mouth at bedtime as needed., Disp: , Rfl:    vitamin C (ASCORBIC ACID) 500 MG tablet, Take 500 mg by mouth 2 (two) times daily., Disp: , Rfl:   Observations/Objective: Patient is well-developed, well-nourished in no acute distress.  Resting comfortably  at home.  Head is normocephalic, atraumatic.  No labored breathing.  Speech is clear and coherent with logical content.  Patient is alert and oriented at baseline.    Assessment and Plan: 1. Elevated BP without diagnosis of hypertension  DASH diet, increase water intake, follow up with pcp for physical and monitoring Check BP BID and take log with her to PCP.   Follow Up Instructions: I discussed the assessment and treatment plan with the patient. The patient was provided an opportunity to ask questions and all were answered. The patient agreed with the plan and demonstrated an understanding of the instructions.  A copy of instructions were sent to the patient via MyChart unless otherwise noted below.     The patient was advised to call back or seek an in-person evaluation if the symptoms worsen or if the condition fails to improve as anticipated.  Time:  I spent 10 minutes with the patient via telehealth technology discussing the above problems/concerns.    Dellia Nims, FNP

## 2022-09-07 NOTE — Patient Instructions (Signed)
Hypertension, Adult High blood pressure (hypertension) is when the force of blood pumping through the arteries is too strong. The arteries are the blood vessels that carry blood from the heart throughout the body. Hypertension forces the heart to work harder to pump blood and may cause arteries to become narrow or stiff. Untreated or uncontrolled hypertension can lead to a heart attack, heart failure, a stroke, kidney disease, and other problems. A blood pressure reading consists of a higher number over a lower number. Ideally, your blood pressure should be below 120/80. The first ("top") number is called the systolic pressure. It is a measure of the pressure in your arteries as your heart beats. The second ("bottom") number is called the diastolic pressure. It is a measure of the pressure in your arteries as the heart relaxes. What are the causes? The exact cause of this condition is not known. There are some conditions that result in high blood pressure. What increases the risk? Certain factors may make you more likely to develop high blood pressure. Some of these risk factors are under your control, including: Smoking. Not getting enough exercise or physical activity. Being overweight. Having too much fat, sugar, calories, or salt (sodium) in your diet. Drinking too much alcohol. Other risk factors include: Having a personal history of heart disease, diabetes, high cholesterol, or kidney disease. Stress. Having a family history of high blood pressure and high cholesterol. Having obstructive sleep apnea. Age. The risk increases with age. What are the signs or symptoms? High blood pressure may not cause symptoms. Very high blood pressure (hypertensive crisis) may cause: Headache. Fast or irregular heartbeats (palpitations). Shortness of breath. Nosebleed. Nausea and vomiting. Vision changes. Severe chest pain, dizziness, and seizures. How is this diagnosed? This condition is diagnosed by  measuring your blood pressure while you are seated, with your arm resting on a flat surface, your legs uncrossed, and your feet flat on the floor. The cuff of the blood pressure monitor will be placed directly against the skin of your upper arm at the level of your heart. Blood pressure should be measured at least twice using the same arm. Certain conditions can cause a difference in blood pressure between your right and left arms. If you have a high blood pressure reading during one visit or you have normal blood pressure with other risk factors, you may be asked to: Return on a different day to have your blood pressure checked again. Monitor your blood pressure at home for 1 week or longer. If you are diagnosed with hypertension, you may have other blood or imaging tests to help your health care provider understand your overall risk for other conditions. How is this treated? This condition is treated by making healthy lifestyle changes, such as eating healthy foods, exercising more, and reducing your alcohol intake. You may be referred for counseling on a healthy diet and physical activity. Your health care provider may prescribe medicine if lifestyle changes are not enough to get your blood pressure under control and if: Your systolic blood pressure is above 130. Your diastolic blood pressure is above 80. Your personal target blood pressure may vary depending on your medical conditions, your age, and other factors. Follow these instructions at home: Eating and drinking  Eat a diet that is high in fiber and potassium, and low in sodium, added sugar, and fat. An example of this eating plan is called the DASH diet. DASH stands for Dietary Approaches to Stop Hypertension. To eat this way: Eat   plenty of fresh fruits and vegetables. Try to fill one half of your plate at each meal with fruits and vegetables. Eat whole grains, such as whole-wheat pasta, brown rice, or whole-grain bread. Fill about one  fourth of your plate with whole grains. Eat or drink low-fat dairy products, such as skim milk or low-fat yogurt. Avoid fatty cuts of meat, processed or cured meats, and poultry with skin. Fill about one fourth of your plate with lean proteins, such as fish, chicken without skin, beans, eggs, or tofu. Avoid pre-made and processed foods. These tend to be higher in sodium, added sugar, and fat. Reduce your daily sodium intake. Many people with hypertension should eat less than 1,500 mg of sodium a day. Do not drink alcohol if: Your health care provider tells you not to drink. You are pregnant, may be pregnant, or are planning to become pregnant. If you drink alcohol: Limit how much you have to: 0-1 drink a day for women. 0-2 drinks a day for men. Know how much alcohol is in your drink. In the [place] one drink equals one 12 oz bottle of beer (355 mL), one 5 oz glass of wine (148 mL), or one 1 oz glass of hard liquor (44 mL). Lifestyle  Work with your health care provider to maintain a healthy body weight or to lose weight. Ask what an ideal weight is for you. Get at least 30 minutes of exercise that causes your heart to beat faster (aerobic exercise) most days of the week. Activities may include walking, swimming, or biking. Include exercise to strengthen your muscles (resistance exercise), such as Pilates or lifting weights, as part of your weekly exercise routine. Try to do these types of exercises for 30 minutes at least 3 days a week. Do not use any products that contain nicotine or tobacco. These products include cigarettes, chewing tobacco, and vaping devices, such as e-cigarettes. If you need help quitting, ask your health care provider. Monitor your blood pressure at home as told by your health care provider. Keep all follow-up visits. This is important. Medicines Take over-the-counter and prescription medicines only as told by your health care provider. Follow directions carefully. Blood  pressure medicines must be taken as prescribed. Do not skip doses of blood pressure medicine. Doing this puts you at risk for problems and can make the medicine less effective. Ask your health care provider about side effects or reactions to medicines that you should watch for. Contact a health care provider if you: Think you are having a reaction to a medicine you are taking. Have headaches that keep coming back (recurring). Feel dizzy. Have swelling in your ankles. Have trouble with your vision. Get help right away if you: Develop a severe headache or confusion. Have unusual weakness or numbness. Feel faint. Have severe pain in your chest or abdomen. Vomit repeatedly. Have trouble breathing. These symptoms may be an emergency. Get help right away. Call 911. Do not wait to see if the symptoms will go away. Do not drive yourself to the hospital. Summary Hypertension is when the force of blood pumping through your arteries is too strong. If this condition is not controlled, it may put you at risk for serious complications. Your personal target blood pressure may vary depending on your medical conditions, your age, and other factors. For most people, a normal blood pressure is less than 120/80. Hypertension is treated with lifestyle changes, medicines, or a combination of both. Lifestyle changes include losing weight, eating a healthy,   low-sodium diet, exercising more, and limiting alcohol. This information is not intended to replace advice given to you by your health care provider. Make sure you discuss any questions you have with your health care provider. Document Revised: 08/01/2021 Document Reviewed: 08/01/2021 Elsevier Patient Education  2023 Elsevier Inc.  

## 2023-02-21 ENCOUNTER — Other Ambulatory Visit: Payer: Self-pay | Admitting: Family Medicine

## 2023-02-21 DIAGNOSIS — N281 Cyst of kidney, acquired: Secondary | ICD-10-CM

## 2023-02-21 DIAGNOSIS — E559 Vitamin D deficiency, unspecified: Secondary | ICD-10-CM

## 2023-02-21 DIAGNOSIS — E7800 Pure hypercholesterolemia, unspecified: Secondary | ICD-10-CM

## 2023-02-21 NOTE — Addendum Note (Signed)
Addended by: Eustaquio Boyden on: 02/21/2023 10:22 PM   Modules accepted: Orders

## 2023-02-25 ENCOUNTER — Other Ambulatory Visit (INDEPENDENT_AMBULATORY_CARE_PROVIDER_SITE_OTHER): Payer: Medicare Other

## 2023-02-25 DIAGNOSIS — N281 Cyst of kidney, acquired: Secondary | ICD-10-CM

## 2023-02-25 DIAGNOSIS — E559 Vitamin D deficiency, unspecified: Secondary | ICD-10-CM

## 2023-02-25 DIAGNOSIS — E7800 Pure hypercholesterolemia, unspecified: Secondary | ICD-10-CM | POA: Diagnosis not present

## 2023-02-25 LAB — URINALYSIS, ROUTINE W REFLEX MICROSCOPIC
Bilirubin Urine: NEGATIVE
Hgb urine dipstick: NEGATIVE
Ketones, ur: NEGATIVE
Leukocytes,Ua: NEGATIVE
Nitrite: NEGATIVE
RBC / HPF: NONE SEEN (ref 0–?)
Specific Gravity, Urine: <=1.005 — AB (ref 1.000–1.030)
Total Protein, Urine: NEGATIVE
Urine Glucose: NEGATIVE
Urobilinogen, UA: 0.2 (ref 0.0–1.0)
WBC, UA: NONE SEEN (ref 0–?)
pH: 7 (ref 5.0–8.0)

## 2023-02-25 LAB — COMPREHENSIVE METABOLIC PANEL
ALT: 12 U/L (ref 0–35)
AST: 16 U/L (ref 0–37)
Albumin: 4.1 g/dL (ref 3.5–5.2)
Alkaline Phosphatase: 80 U/L (ref 39–117)
BUN: 16 mg/dL (ref 6–23)
CO2: 30 mEq/L (ref 19–32)
Calcium: 9.8 mg/dL (ref 8.4–10.5)
Chloride: 103 mEq/L (ref 96–112)
Creatinine, Ser: 0.7 mg/dL (ref 0.40–1.20)
GFR: 88.88 mL/min (ref >60.00)
Glucose, Bld: 89 mg/dL (ref 70–99)
Potassium: 3.9 mEq/L (ref 3.5–5.1)
Sodium: 140 mEq/L (ref 135–145)
Total Bilirubin: 0.6 mg/dL (ref 0.2–1.2)
Total Protein: 6.8 g/dL (ref 6.0–8.3)

## 2023-02-25 LAB — LIPID PANEL
Cholesterol: 193 mg/dL (ref 0–200)
HDL: 55.5 mg/dL (ref >39.00)
LDL Cholesterol: 119 mg/dL — ABNORMAL HIGH (ref 0–99)
NonHDL: 137.28
Total CHOL/HDL Ratio: 3
Triglycerides: 93 mg/dL (ref 0.0–149.0)
VLDL: 18.6 mg/dL (ref 0.0–40.0)

## 2023-02-26 LAB — VITAMIN D 25 HYDROXY (VIT D DEFICIENCY, FRACTURES): VITD: 39.52 ng/mL (ref 30.00–100.00)

## 2023-03-04 ENCOUNTER — Encounter: Payer: Medicare Other | Admitting: Family Medicine

## 2023-03-05 ENCOUNTER — Encounter: Payer: Medicare Other | Admitting: Family Medicine

## 2023-04-17 ENCOUNTER — Ambulatory Visit (INDEPENDENT_AMBULATORY_CARE_PROVIDER_SITE_OTHER): Payer: Medicare Other | Admitting: Family Medicine

## 2023-04-17 ENCOUNTER — Encounter: Payer: Self-pay | Admitting: Family Medicine

## 2023-04-17 VITALS — BP 144/82 | HR 81 | Temp 97.6°F | Ht 62.5 in | Wt 167.2 lb

## 2023-04-17 DIAGNOSIS — Z23 Encounter for immunization: Secondary | ICD-10-CM | POA: Diagnosis not present

## 2023-04-17 DIAGNOSIS — Z7189 Other specified counseling: Secondary | ICD-10-CM

## 2023-04-17 DIAGNOSIS — R030 Elevated blood-pressure reading, without diagnosis of hypertension: Secondary | ICD-10-CM

## 2023-04-17 DIAGNOSIS — N281 Cyst of kidney, acquired: Secondary | ICD-10-CM

## 2023-04-17 DIAGNOSIS — E669 Obesity, unspecified: Secondary | ICD-10-CM

## 2023-04-17 DIAGNOSIS — E559 Vitamin D deficiency, unspecified: Secondary | ICD-10-CM | POA: Diagnosis not present

## 2023-04-17 DIAGNOSIS — Z8249 Family history of ischemic heart disease and other diseases of the circulatory system: Secondary | ICD-10-CM

## 2023-04-17 DIAGNOSIS — M8588 Other specified disorders of bone density and structure, other site: Secondary | ICD-10-CM

## 2023-04-17 DIAGNOSIS — Z6379 Other stressful life events affecting family and household: Secondary | ICD-10-CM

## 2023-04-17 DIAGNOSIS — Z0000 Encounter for general adult medical examination without abnormal findings: Secondary | ICD-10-CM

## 2023-04-17 DIAGNOSIS — K5909 Other constipation: Secondary | ICD-10-CM

## 2023-04-17 DIAGNOSIS — E7800 Pure hypercholesterolemia, unspecified: Secondary | ICD-10-CM

## 2023-04-17 NOTE — Patient Instructions (Addendum)
Prevnar-20 today  You may call [facility] to schedule an appointment at [phone].  Call to schedule mammogram at your convenience: [facility] [phone] We will order coronary CT scan with calcium score at [facility] outpatient imaging center.  BP elevated - start monitoring daily with log provided, drop off in 2 weeks to review readings.  Send [place] dates of 2nd shingles shot to update chart Bring [place] copy of your living will when completed  Return in 1 year for next physical.

## 2023-04-17 NOTE — Progress Notes (Unsigned)
Ph: [phone] Fax: [phone]   Patient ID: Maria Aguirre, female    DOB: 1956-06-28, 67 y.o.   MRN: [id number]  This visit was conducted in person.  BP (!) 144/82 (BP Location: Right Arm, Cuff Size: Normal)   Pulse 81   Temp 97.6 F (36.4 C) (Temporal)   Ht 5' 2.5" (1.588 m)   Wt 167 lb 4 oz (75.9 kg)   SpO2 97%   BMI 30.10 kg/m   BP Readings from Last 3 Encounters:  04/17/23 (!) 144/82  01/28/20 136/80  12/08/19 130/80   CC: CPE Subjective:   HPI: Maria Aguirre is a 67 y.o. female presenting on 04/17/2023 for Annual Exam [facility] prt 2 [AWV-07/02/22].)   Saw health advisor 06/2022 for medicare wellness visit. Note reviewed.   No results found.  Flowsheet Row Clinical Support from 07/02/2022 in [facility]  PHQ-2 Total Score 2          07/02/2022    9:54 AM 06/28/2022    8:44 AM 06/17/2021    9:21 AM 12/08/2019    4:16 PM  Fall Risk   Falls in the past year? 1 0 1 0  Number falls in past yr: 0  0   Injury with Fall? 1  0   Risk for fall due to : No Fall Risks  Impaired mobility   Follow up Falls prevention discussed;Falls evaluation completed      Elevated BP readings recently - she attributes to increased stress recently. Mother is living with her and her husband.  Fmhx CAD - father (stents at age 60s), paternal uncle, paternal grandmother.  No chest pain, exertional dyspnea.  She has upcoming appt with Dr Izora Ribas - requests referral.   Complex 8mm Bosinak IIF R renal cyst 02/2018 [facility])  - on repeat renal US 12/2019 12mm exophytic R upper pole Bosniak I cyst  Periodontal issues - sees Dr Carin Primrose Q4 months    Preventative: COLONOSCOPY 10/2017 - TAs, diverticulosis, rpt 3 yrs ([facility])  [facility] Does breast exams at home.  Well woman exam - prior saw Dr Billy Coast. Normal pap smears in the past - latest 2016. No pelvic pain, vaginal bleeding. DEXA 02/2020: T -1.5 spine  Lung cancer screen -  not eligible  Flu shot yearly Td - 05/2009, Tdap done 02/2020 Prevnar-20 today  Pfizer covid 12/04/2019 shingrix - 08/2022, completed 2nd - will send [place] dates  Advanced directive planning - daughter RN would be HCPOA. Ok with CPR but would not want prolonged life support if terminal condition. Asked to bring copy when complete  Seat belt use discussed Sunscreen use discussed - no changing moles on skin.  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  Activity: walking at work, [employer], [employer]  Diet: good water, fruits/vegetables daily      Relevant past medical, surgical, family and social history reviewed and updated as indicated. Interim medical history since our last visit reviewed. Allergies and medications reviewed and updated. Outpatient Medications Prior to Visit  Medication Sig Dispense Refill   ASPIRIN 81 PO Take 1 tablet by mouth daily.  B COMPLEX VITAMINS PO Take by mouth.     Calcium Carbonate 260 MG CHEW Chew 2 tablets by mouth 2 (two) times daily.     cholecalciferol (VITAMIN D) 1000 UNITS tablet Take 1,000 Units by mouth 2 (two) times daily.     diphenhydrAMINE (BENADRYL) 25 mg capsule Take 25 mg by mouth at bedtime. As needed     diphenhydramine-acetaminophen (TYLENOL PM) 25-500 MG TABS tablet Take 1 tablet by mouth at bedtime as needed.     vitamin C (ASCORBIC ACID) 500 MG tablet Take 500 mg by mouth 2 (two) times daily.     No facility-administered medications prior to visit.     Per HPI unless specifically indicated in ROS section below Review of Systems  Constitutional:  Negative for activity change, appetite change, chills, fatigue, fever and unexpected weight  change.  HENT:  Negative for hearing loss.   Eyes:  Negative for visual disturbance.  Respiratory:  Positive for cough. Negative for chest tightness, shortness of breath and wheezing.   Cardiovascular:  Negative for chest pain, palpitations and leg swelling.  Gastrointestinal:  Positive for constipation. Negative for abdominal distention, abdominal pain, blood in stool, diarrhea, nausea and vomiting.  Genitourinary:  Negative for difficulty urinating and hematuria.  Musculoskeletal:  Negative for arthralgias, myalgias and neck pain.  Skin:  Negative for rash.  Neurological:  Negative for dizziness, seizures, syncope and headaches.  Hematological:  Negative for adenopathy. Does not bruise/bleed easily.  Psychiatric/Behavioral:  Negative for dysphoric mood. The patient is nervous/anxious.     Objective:  BP (!) 144/82 (BP Location: Right Arm, Cuff Size: Normal)   Pulse 81   Temp 97.6 F (36.4 C) (Temporal)   Ht 5' 2.5" (1.588 m)   Wt 167 lb 4 oz (75.9 kg)   SpO2 97%   BMI 30.10 kg/m   Wt Readings from Last 3 Encounters:  04/17/23 167 lb 4 oz (75.9 kg)  07/02/22 181 lb (82.1 kg)  01/28/20 181 lb (82.1 kg)      Physical Exam Vitals and nursing note reviewed.  Constitutional:      Appearance: Normal appearance. She is not ill-appearing.  HENT:     Head: Normocephalic and atraumatic.     Right Ear: Tympanic membrane, ear canal and external ear normal. There is no impacted cerumen.     Left Ear: Tympanic membrane, ear canal and external ear normal. There is no impacted cerumen.     Mouth/Throat:     Mouth: Mucous membranes are moist.     Pharynx: Oropharynx is clear. No oropharyngeal exudate or posterior oropharyngeal erythema.  Eyes:     General:        Right eye: No discharge.        Left eye: No discharge.     Extraocular Movements: Extraocular movements intact.     Conjunctiva/sclera: Conjunctivae normal.     Pupils: Pupils are equal, round, and reactive to light.  Neck:      Thyroid: No thyroid mass or thyromegaly.     Vascular: No carotid bruit.  Cardiovascular:     Rate and Rhythm: Normal rate and regular rhythm.     Pulses: Normal pulses.     Heart sounds: Normal heart sounds. No murmur heard. Pulmonary:     Effort: Pulmonary effort is normal. No respiratory distress.     Breath sounds: Normal breath sounds. No wheezing, rhonchi or rales.  Abdominal:     General: Bowel sounds are normal. There is no distension.  Palpations: Abdomen is soft. There is no mass.     Tenderness: There is no abdominal tenderness. There is no guarding or rebound.     Hernia: No hernia is present.  Musculoskeletal:     Cervical back: Normal range of motion and neck supple. No rigidity.     Right lower leg: No edema.     Left lower leg: No edema.  Lymphadenopathy:     Cervical: No cervical adenopathy.  Skin:    General: Skin is warm and dry.     Findings: No rash.  Neurological:     General: No focal deficit present.     Mental Status: She is alert. Mental status is at baseline.  Psychiatric:        Mood and Affect: Mood normal.        Behavior: Behavior normal.       Results for orders placed or performed in visit on 02/25/23  Urinalysis, Routine w reflex microscopic  Result Value Ref Range   Color, Urine YELLOW Yellow;Lt. Yellow;Straw;Dark Yellow;Amber;Green;Red;Brown   APPearance CLEAR Clear;Turbid;Slightly Cloudy;Cloudy   Specific Gravity, Urine <=1.005 (A) 1.000 - 1.030   pH 7.0 5.0 - 8.0   Total Protein, Urine NEGATIVE Negative   Urine Glucose NEGATIVE Negative   Ketones, ur NEGATIVE Negative   Bilirubin Urine NEGATIVE Negative   Hgb urine dipstick NEGATIVE Negative   Urobilinogen, UA 0.2 0.0 - 1.0   Leukocytes,Ua NEGATIVE Negative   Nitrite NEGATIVE Negative   WBC, UA none seen 0-2/hpf   RBC / HPF none seen 0-2/hpf   Squamous Epithelial / HPF Rare(0-4/hpf) Rare(0-4/hpf)  VITAMIN D 25 Hydroxy (Vit-D Deficiency, Fractures)  Result Value Ref Range    VITD 39.52 30.00 - 100.00 ng/mL  Comprehensive metabolic panel  Result Value Ref Range   Sodium 140 135 - 145 mEq/L   Potassium 3.9 3.5 - 5.1 mEq/L   Chloride 103 96 - 112 mEq/L   CO2 30 19 - 32 mEq/L   Glucose, Bld 89 70 - 99 mg/dL   BUN 16 6 - 23 mg/dL   Creatinine, Ser 7.82 0.40 - 1.20 mg/dL   Total Bilirubin 0.6 0.2 - 1.2 mg/dL   Alkaline Phosphatase 80 39 - 117 U/L   AST 16 0 - 37 U/L   ALT 12 0 - 35 U/L   Total Protein 6.8 6.0 - 8.3 g/dL   Albumin 4.1 3.5 - 5.2 g/dL   GFR 95.62 >13.08 mL/min   Calcium 9.8 8.4 - 10.5 mg/dL  Lipid panel  Result Value Ref Range   Cholesterol 193 0 - 200 mg/dL   Triglycerides 65.7 0.0 - 149.0 mg/dL   HDL 84.69 >62.95 mg/dL   VLDL 28.4 0.0 - 13.2 mg/dL   LDL Cholesterol 440 (H) 0 - 99 mg/dL   Total CHOL/HDL Ratio 3    NonHDL 137.28    Lab Results  Component Value Date   TSH 1.54 09/03/2017   Assessment & Plan:   Problem List Items Addressed This Visit   None    No orders of the defined types were placed in this encounter.   No orders of the defined types were placed in this encounter.   Patient Instructions  Prevnar-20 today  You may call [facility] to schedule an appointment at [phone].  Call to schedule mammogram at your convenience: [facility] [phone] We will order coronary CT scan with calcium score at [facility] outpatient imaging center.  BP elevated - start monitoring  daily with log provided, drop off in 2 weeks to review readings.  Send [place] dates of 2nd shingles shot to update chart Bring [place] copy of your living will when completed    Follow up plan: Return in about 6 months (around 10/18/2023) for annual exam, prior fasting for blood work.  Eustaquio Boyden, MD

## 2023-04-18 ENCOUNTER — Encounter: Payer: Self-pay | Admitting: *Deleted

## 2023-04-18 DIAGNOSIS — Z8249 Family history of ischemic heart disease and other diseases of the circulatory system: Secondary | ICD-10-CM | POA: Insufficient documentation

## 2023-04-18 DIAGNOSIS — Z6379 Other stressful life events affecting family and household: Secondary | ICD-10-CM | POA: Insufficient documentation

## 2023-04-18 DIAGNOSIS — K5909 Other constipation: Secondary | ICD-10-CM | POA: Insufficient documentation

## 2023-04-18 DIAGNOSIS — R030 Elevated blood-pressure reading, without diagnosis of hypertension: Secondary | ICD-10-CM | POA: Insufficient documentation

## 2023-04-18 MED ORDER — BENEFIBER PO POWD: ORAL | Status: AC

## 2023-04-18 NOTE — Assessment & Plan Note (Signed)
Continues regular calcium and vit D supplementation

## 2023-04-18 NOTE — Assessment & Plan Note (Addendum)
Chronic, mild off medication. Improving over the last 2 yrs. Endorses fmhx CAD -discussed checking Lp(a) next labwork.  The 10-year ASCVD risk score (Arnett DK, et al., 2019) is: 9.4%   Values used to calculate the score:     Age: 67 years     Sex: Female     Is Non-Hispanic African American: No     Diabetic: No     Tobacco smoker: No     Systolic Blood Pressure: 144 mmHg     Is BP treated: No     HDL Cholesterol: 55.5 mg/dL     Total Cholesterol: 193 mg/dL

## 2023-04-18 NOTE — Assessment & Plan Note (Signed)
Discussed.

## 2023-04-18 NOTE — Assessment & Plan Note (Signed)
Benign on latest renal imaging 2021.

## 2023-04-18 NOTE — Assessment & Plan Note (Signed)
Encouraged healthy diet and lifestyle choices.  

## 2023-04-18 NOTE — Assessment & Plan Note (Signed)
Advanced directive planning - daughter RN would be HCPOA. Ok with CPR but would not want prolonged life support if terminal condition. Asked to bring copy when complete

## 2023-04-18 NOTE — Assessment & Plan Note (Signed)
Lifelong issue.  Check TSH next labwork .

## 2023-04-18 NOTE — Assessment & Plan Note (Signed)
Few elevated readings in office and at home.  Provided with DASH diet handout.  Reviewed optima strategy to take blood pressure at home.  Recommend she regularly monitor at home for 2 wks and send me readings to decide on antihypertensive management.

## 2023-04-18 NOTE — Assessment & Plan Note (Addendum)
Preventative protocols reviewed and updated unless pt declined. Discussed healthy diet and lifestyle.  #s provided for pt to call and schedule overdue preventative healthcare.

## 2023-04-18 NOTE — Assessment & Plan Note (Signed)
Paternal side - father, uncle, grandmother.  With HLD, recently elevated BP readings, discussed coronary CTA to further guide cholesterol management.  She also requests cardiology referral - placed. She has already scheduled appt with Dr Izora Ribas.

## 2023-04-18 NOTE — Assessment & Plan Note (Signed)
Continue 2000 international units daily

## 2023-04-30 ENCOUNTER — Ambulatory Visit
Admission: RE | Admit: 2023-04-30 | Discharge: 2023-04-30 | Disposition: A | Discharge status: Home / Self Care | Payer: Medicare Other | Source: Ambulatory Visit | Attending: Family Medicine | Admitting: Family Medicine

## 2023-04-30 DIAGNOSIS — Z8249 Family history of ischemic heart disease and other diseases of the circulatory system: Secondary | ICD-10-CM | POA: Insufficient documentation

## 2023-04-30 DIAGNOSIS — E7800 Pure hypercholesterolemia, unspecified: Secondary | ICD-10-CM | POA: Insufficient documentation

## 2023-05-09 ENCOUNTER — Telehealth: Payer: Self-pay | Admitting: Family Medicine

## 2023-05-09 NOTE — Telephone Encounter (Signed)
Placed BP log in Dr. G's box.  

## 2023-05-09 NOTE — Telephone Encounter (Signed)
Pt came in office to droop off Blood pressure log for PCP to review . It was place in PCP folder

## 2023-05-10 NOTE — Telephone Encounter (Signed)
Spoke with pt relaying Dr. G's message. Pt verbalizes understanding.  

## 2023-05-10 NOTE — Telephone Encounter (Signed)
Reviewed BP log - overall good control with BP ranging 110s-130s/70-90s.  Continue to monitor at home, let me know if consistently >140/90 to consider medication.

## 2023-05-23 ENCOUNTER — Encounter (INDEPENDENT_AMBULATORY_CARE_PROVIDER_SITE_OTHER): Payer: Self-pay

## 2023-05-28 ENCOUNTER — Ambulatory Visit: Payer: Medicare Other | Admitting: Internal Medicine

## 2023-06-24 ENCOUNTER — Other Ambulatory Visit: Payer: Self-pay | Admitting: Family Medicine

## 2023-06-24 DIAGNOSIS — Z1231 Encounter for screening mammogram for malignant neoplasm of breast: Secondary | ICD-10-CM

## 2023-08-08 ENCOUNTER — Ambulatory Visit (INDEPENDENT_AMBULATORY_CARE_PROVIDER_SITE_OTHER): Payer: Medicare Other

## 2023-08-08 VITALS — Ht 64.5 in | Wt 167.0 lb

## 2023-08-08 DIAGNOSIS — Z0000 Encounter for general adult medical examination without abnormal findings: Secondary | ICD-10-CM

## 2023-08-08 DIAGNOSIS — Z1211 Encounter for screening for malignant neoplasm of colon: Secondary | ICD-10-CM | POA: Diagnosis not present

## 2023-08-08 NOTE — Patient Instructions (Addendum)
Ms. Maria Aguirre , Thank you for taking time to come for your Medicare Wellness Visit. I appreciate your ongoing commitment to your health goals. Please review the following plan we discussed and let me know if I can assist you in the future.   Referrals/Orders/Follow-Ups/Clinician Recommendations:   Mammogram order already placed: call to reschedule:  [facility] [employer] [phone]  _________________________________________________________________________  Maria Aguirre have been referred to see a gastroenterologist to discuss a colon cancer screening. If you haven't heard from that office in 7 business days, please call them to schedule your appointment.   [facility] [address], [place], [place] [address]  [address] PHONE: [phone] [email or web address]    This is a list of the screening recommended for you and due dates:  Health Maintenance  Topic Date Due   Mammogram  02/14/2022   COVID-19 Vaccine (3 - 2023-24 season) 08/24/2023*   Colon Cancer Screening  09/07/2023*   Zoster (Shingles) Vaccine (2 of 2) 11/08/2023*   Flu Shot  01/06/2024*   Medicare Annual Wellness Visit  08/07/2024   DTaP/Tdap/Td vaccine (3 - Td or Tdap) 02/05/2030   Pneumonia Vaccine  Completed   DEXA scan (bone density measurement)  Completed   Hepatitis C Screening  Completed   HPV Vaccine  Aged Out  *Topic was postponed. The date shown is not the original due date.    Advanced directives: (Declined) Advance directive discussed with you today. Even though you declined this today, please call our office should you change your mind, and we can give you the proper paperwork for you to fill out. Pt says she will be getting this done  Next Medicare Annual Wellness Visit scheduled for next year: Yes 08/12/2024 @ 9:30am telephone

## 2023-08-08 NOTE — Progress Notes (Signed)
Subjective:   Maria Aguirre is a 67 y.o. female who presents for Medicare Annual (Subsequent) preventive examination.  Visit Complete: Virtual I connected with  Rod Holler on 08/08/23 by a audio enabled telemedicine application and verified that I am speaking with the correct person using two identifiers.  Patient Location: Home  Provider Location: Office/Clinic  I discussed the limitations of evaluation and management by telemedicine. The patient expressed understanding and agreed to proceed.  Vital Signs: Because this visit was a virtual/telehealth visit, some criteria may be missing or patient reported. Any vitals not documented were not able to be obtained and vitals that have been documented are patient reported.  Patient Medicare AWV questionnaire was completed by the patient on 08/07/23; I have confirmed that all information answered by patient is correct and no changes since this date. Cardiac Risk Factors include: advanced age (>48men, >23 women)    Objective:    Today's Vitals   08/07/23 1203 08/08/23 1000  Weight:  167 lb (75.8 kg)  Height:  5' 4.5" (1.638 m)  PainSc: 3     Body mass index is 28.22 kg/m.     08/08/2023   10:08 AM 07/02/2022    9:53 AM 04/25/2015    8:02 AM  Advanced Directives  Does Patient Have a Medical Advance Directive? No No No  Would patient like information on creating a medical advance directive?  No - Patient declined     Current Medications (verified) Outpatient Encounter Medications as of 08/08/2023  Medication Sig   ASPIRIN 81 PO Take 1 tablet by mouth daily.   B COMPLEX VITAMINS PO Take by mouth.   Calcium Carbonate 260 MG CHEW Chew 2 tablets by mouth 2 (two) times daily.   cholecalciferol (VITAMIN D) 1000 UNITS tablet Take 1,000 Units by mouth 2 (two) times daily.   diphenhydrAMINE (BENADRYL) 25 mg capsule Take 25 mg by mouth at bedtime. As needed   diphenhydramine-acetaminophen (TYLENOL PM) 25-500 MG TABS tablet Take 1  tablet by mouth at bedtime as needed.   vitamin C (ASCORBIC ACID) 500 MG tablet Take 500 mg by mouth 2 (two) times daily.   Wheat Dextrin (BENEFIBER) POWD Use daily PRN constipation   No facility-administered encounter medications on file as of 08/08/2023.    Allergies (verified) Shellfish allergy, Poison ivy extract, and Tilactase   History: Past Medical History:  Diagnosis Date   Allergy    Cerebral aneurysm remote   ?of this - told by neurologist, was told at age 84   GERD (gastroesophageal reflux disease)    PRN   History of migraine with aura    "no migraine in a long time"   Migraine with aura 01/28/2020   Rosacea    dairy flares up rosacea   Seasonal allergies    Past Surgical History:  Procedure Laterality Date   APPENDECTOMY  2004   BUNIONECTOMY Bilateral    CARPAL TUNNEL RELEASE Bilateral    COLONOSCOPY  10/2017   TAs, diverticulosis, rpt 3 yrs ([facility])   TUBAL LIGATION     Family History  Problem Relation Age of Onset   CAD Father        stents   Colon cancer Maternal Grandmother 85   CAD Paternal Grandmother    Heart failure Paternal Grandmother    Parkinson's disease Maternal Grandfather    Thyroid disease Mother    High Cholesterol Mother    Heart disease Sister 35       pacemaker  CAD Paternal Uncle        MI   Diabetes Neg Hx    Esophageal cancer Neg Hx    Stomach cancer Neg Hx    Rectal cancer Neg Hx    Colon polyps Neg Hx    Social History   Socioeconomic History   Marital status: Married    Spouse name: Not on file   Number of children: Not on file   Years of education: Not on file   Highest education level: Not on file  Occupational History   Not on file  Tobacco Use   Smoking status: Never   Smokeless tobacco: Never  Vaping Use   Vaping status: Never Used  Substance and Sexual Activity   Alcohol use: Yes    Comment: occasionally bi-weekly beer per pt   Drug use: No   Sexual activity: Yes    Birth control/protection:  None  Other Topics Concern   Not on file  Social History Narrative   Lives with husband Sports administrator) and mother, dog   Occupation: retired from post office, working full time at [employer]   Activity: walking at work, [employer], Presenter, broadcasting   Diet: good water, fruits/vegetables daily   Social Determinants of Corporate investment banker Strain: Low Risk  (08/07/2023)   Overall Financial Resource Strain (CARDIA)    Difficulty of Paying Living Expenses: Not hard at all  Food Insecurity: No Food Insecurity (08/07/2023)   Hunger Vital Sign    Worried About Running Out of Food in the Last Year: Never true    Ran Out of Food in the Last Year: Never true  Transportation Needs: No Transportation Needs (08/07/2023)   PRAPARE - Administrator, Civil Service (Medical): No    Lack of Transportation (Non-Medical): No  Physical Activity: Sufficiently Active (08/07/2023)   Exercise Vital Sign    Days of Exercise per Week: 6 days    Minutes of Exercise per Session: 90 min  Stress: No Stress Concern Present (08/07/2023)   [employer] of Occupational Health - Occupational Stress Questionnaire    Feeling of Stress : Not at all  Social Connections: Unknown (08/07/2023)   Social Connection and Isolation Panel [NHANES]    Frequency of Communication with Friends and Family: More than three times a week    Frequency of Social Gatherings with Friends and Family: More than three times a week    Attends Religious Services: Not on Marketing executive or Organizations: No    Attends Banker Meetings: Never    Marital Status: Married    Tobacco Counseling Counseling given: Not Answered   Clinical Intake:  Pre-visit preparation completed: No  Pain : 0-10 Pain Score: 3  Pain Type: Chronic pain Pain Location: Hip Pain Orientation: Right Pain Descriptors / Indicators: Aching Pain Onset: More than a month ago Pain Frequency: Intermittent Pain Relieving Factors:  Advil, heat  Pain Relieving Factors: Advil, heat  BMI - recorded: 28.22 Nutritional Status: BMI 25 -29 Overweight Nutritional Risks: None Diabetes: No  How often do you need to have someone help you when you read instructions, pamphlets, or other written materials from your doctor or pharmacy?: 1 - Never  Interpreter Needed?: No  Comments: lives with husband Information entered by :: B.Sicily Zaragoza,LPN   Activities of Daily Living    08/07/2023   12:03 PM  In your present state of health, do you have any difficulty performing the following activities:  Hearing?  0  Vision? 0  Difficulty concentrating or making decisions? 0  Walking or climbing stairs? 0  Dressing or bathing? 0  Doing errands, shopping? 0  Preparing Food and eating ? N  Using the Toilet? N  In the past six months, have you accidently leaked urine? N  Do you have problems with loss of bowel control? N  Managing your Medications? N  Managing your Finances? N  Housekeeping or managing your Housekeeping? N    Patient Care Team: Eustaquio Boyden, MD as PCP - General (Family Medicine)  Indicate any recent Medical Services you may have received from other than [facility] providers in the past year (date may be approximate).     Assessment:   This is a routine wellness examination for Tashona.  Hearing/Vision screen Hearing Screening - Comments:: Pt says her hearing is good Vision Screening - Comments:: Pt says her vision is good with glasses Dr Dellie Burns   Goals Addressed             This Visit's Progress    COMPLETED: DIET - EAT MORE FRUITS AND VEGETABLES   On track    Patient Stated       I would like to lose weight 15lb       Depression Screen    08/08/2023   10:07 AM 07/02/2022    9:50 AM 06/17/2021    9:22 AM 12/08/2019    4:18 PM 09/03/2017   11:24 AM  PHQ 2/9 Scores  PHQ - 2 Score 0 2 2 0 1  PHQ- 9 Score   3      Fall Risk    08/07/2023   12:03 PM 07/02/2022    9:54 AM 06/28/2022    8:44  AM 06/17/2021    9:21 AM 12/08/2019    4:16 PM  Fall Risk   Falls in the past year? 1 1 0 1 0  Number falls in past yr: 0 0  0   Injury with Fall? 0 1  0   Risk for fall due to : No Fall Risks No Fall Risks  Impaired mobility   Follow up Education provided;Falls prevention discussed Falls prevention discussed;Falls evaluation completed       MEDICARE RISK AT HOME: Medicare Risk at Home Any stairs in or around the home?: No If so, are there any without handrails?: No Home free of loose throw rugs in walkways, pet beds, electrical cords, etc?: Yes Adequate lighting in your home to reduce risk of falls?: Yes Life alert?: No Use of a cane, walker or w/c?: No Grab bars in the bathroom?: Yes Shower chair or bench in shower?: No Elevated toilet seat or a handicapped toilet?: Yes  TIMED UP AND GO:  Was the test performed?  No    Cognitive Function:        08/08/2023   10:11 AM 07/02/2022    9:57 AM 06/17/2021    9:31 AM  6CIT Screen  What Year? 0 points 0 points 0 points  What month? 0 points 0 points 0 points  What time? 0 points 0 points 0 points  Count back from 20 0 points 0 points 0 points  Months in reverse 0 points 0 points 0 points  Repeat phrase 0 points 0 points 0 points  Total Score 0 points 0 points 0 points    Immunizations Immunization History  Administered Date(s) Administered   Fluad Quad(high Dose 65+) 08/09/2022   Influenza Whole 06/30/2008, 08/23/2009   Influenza-Unspecified 07/02/2013, 07/08/2014,  08/20/2017, 08/10/2019   PFIZER(Purple Top)SARS-COV-2 Vaccination 12/04/2019, 12/29/2019   PNEUMOCOCCAL CONJUGATE-20 04/17/2023   Td 05/31/2009   Tdap 02/06/2020   Zoster Recombinant(Shingrix) 08/09/2022    TDAP status: Up to date  Flu Vaccine status: Due, Education has been provided regarding the importance of this vaccine. Advised may receive this vaccine at local pharmacy or Health Dept. Aware to provide a copy of the vaccination record if obtained from  local pharmacy or Health Dept. Verbalized acceptance and understanding.  Pneumococcal vaccine status: Up to date  Covid-19 vaccine status: Completed vaccines  Qualifies for Shingles Vaccine? Yes   Zostavax completed Yes  #1 Shingrix Completed?: No.    Education has been provided regarding the importance of this vaccine. Patient has been advised to call insurance company to determine out of pocket expense if they have not yet received this vaccine. Advised may also receive vaccine at local pharmacy or Health Dept. Verbalized acceptance and understanding.  Screening Tests Health Maintenance  Topic Date Due   Colonoscopy  10/22/2020   MAMMOGRAM  02/14/2022   Zoster Vaccines- Shingrix (2 of 2) 10/04/2022   INFLUENZA VACCINE  05/09/2023   COVID-19 Vaccine (3 - 2023-24 season) 06/09/2023   Medicare Annual Wellness (AWV)  08/07/2024   DTaP/Tdap/Td (3 - Td or Tdap) 02/05/2030   Pneumonia Vaccine 56+ Years old  Completed   DEXA SCAN  Completed   Hepatitis C Screening  Completed   HPV VACCINES  Aged Out    Health Maintenance  Health Maintenance Due  Topic Date Due   Colonoscopy  10/22/2020   MAMMOGRAM  02/14/2022   Zoster Vaccines- Shingrix (2 of 2) 10/04/2022   INFLUENZA VACCINE  05/09/2023   COVID-19 Vaccine (3 - 2023-24 season) 06/09/2023    Colorectal cancer screening: Referral to GI placed yes. Pt aware the office will call re: appt.  Mammogram status: Ordered yes. Pt provided with contact info and advised to call to schedule appt.   Bone Density status: Completed 02/2020. Results reflect: Bone density results: OSTEOPENIA. Repeat every 3-5 years.  Lung Cancer Screening: (Low Dose CT Chest recommended if Age 31-80 years, 20 pack-year currently smoking OR have quit w/in 15years.) does not qualify.   Lung Cancer Screening Referral: no  Additional Screening:  Hepatitis C Screening: does not qualify; Completed 11/17/2019  Vision Screening: Recommended annual ophthalmology exams  for early detection of glaucoma and other disorders of the eye. Is the patient up to date with their annual eye exam?  Yes  Who is the provider or what is the name of the office in which the patient attends annual eye exams? Dr Dellie Burns If pt is not established with a provider, would they like to be referred to a provider to establish care? No .   Dental Screening: Recommended annual dental exams for proper oral hygiene  Diabetic Foot Exam: n/a  Community Resource Referral / Chronic Care Management: CRR required this visit?  No   CCM required this visit?  No     Plan:     I have personally reviewed and noted the following in the patient's chart:   Medical and social history Use of alcohol, tobacco or illicit drugs  Current medications and supplements including opioid prescriptions. Patient is not currently taking opioid prescriptions. Functional ability and status Nutritional status Physical activity Advanced directives List of other physicians Hospitalizations, surgeries, and ER visits in previous 12 months Vitals Screenings to include cognitive, depression, and falls Referrals and appointments  In addition, I have reviewed  and discussed with patient certain preventive protocols, quality metrics, and best practice recommendations. A written personalized care plan for preventive services as well as general preventive health recommendations were provided to patient.     Sue Lush, LPN   52/84/[id number]   After Visit Summary: (MyChart) Due to this being a telephonic visit, the after visit summary with patients personalized plan was offered to patient via MyChart   Nurse Notes: The patient states she is doing well and has no concerns or questions at this time.

## 2023-08-19 ENCOUNTER — Encounter: Payer: Self-pay | Admitting: *Deleted

## 2024-04-11 ENCOUNTER — Other Ambulatory Visit: Payer: Self-pay | Admitting: Family Medicine

## 2024-04-11 DIAGNOSIS — E559 Vitamin D deficiency, unspecified: Secondary | ICD-10-CM

## 2024-04-11 DIAGNOSIS — E7800 Pure hypercholesterolemia, unspecified: Secondary | ICD-10-CM

## 2024-04-13 ENCOUNTER — Other Ambulatory Visit (INDEPENDENT_AMBULATORY_CARE_PROVIDER_SITE_OTHER): Payer: Medicare Other

## 2024-04-13 ENCOUNTER — Ambulatory Visit: Payer: Self-pay | Admitting: Family Medicine

## 2024-04-13 DIAGNOSIS — E7800 Pure hypercholesterolemia, unspecified: Secondary | ICD-10-CM | POA: Diagnosis not present

## 2024-04-13 DIAGNOSIS — E559 Vitamin D deficiency, unspecified: Secondary | ICD-10-CM

## 2024-04-13 LAB — TSH: TSH: 1.01 u[IU]/mL (ref 0.35–5.50)

## 2024-04-13 LAB — COMPREHENSIVE METABOLIC PANEL WITH GFR
ALT: 10 U/L (ref 0–35)
AST: 14 U/L (ref 0–37)
Albumin: 4.1 g/dL (ref 3.5–5.2)
Alkaline Phosphatase: 80 U/L (ref 39–117)
BUN: 14 mg/dL (ref 6–23)
CO2: 30 meq/L (ref 19–32)
Calcium: 9.2 mg/dL (ref 8.4–10.5)
Chloride: 104 meq/L (ref 96–112)
Creatinine, Ser: 0.65 mg/dL (ref 0.40–1.20)
GFR: 89.76 mL/min (ref >60.00)
Glucose, Bld: 89 mg/dL (ref 70–99)
Potassium: 3.8 meq/L (ref 3.5–5.1)
Sodium: 139 meq/L (ref 135–145)
Total Bilirubin: 0.6 mg/dL (ref 0.2–1.2)
Total Protein: 6.4 g/dL (ref 6.0–8.3)

## 2024-04-13 LAB — LIPID PANEL
Cholesterol: 208 mg/dL — ABNORMAL HIGH (ref 0–200)
HDL: 55.4 mg/dL (ref >39.00)
LDL Cholesterol: 137 mg/dL — ABNORMAL HIGH (ref 0–99)
NonHDL: 152.25
Total CHOL/HDL Ratio: 4
Triglycerides: 78 mg/dL (ref 0.0–149.0)
VLDL: 15.6 mg/dL (ref 0.0–40.0)

## 2024-04-13 LAB — VITAMIN D 25 HYDROXY (VIT D DEFICIENCY, FRACTURES): VITD: 53.12 ng/mL (ref 30.00–100.00)

## 2024-04-20 ENCOUNTER — Encounter: Payer: Medicare Other | Admitting: Family Medicine

## 2024-05-27 ENCOUNTER — Ambulatory Visit (INDEPENDENT_AMBULATORY_CARE_PROVIDER_SITE_OTHER): Admitting: Family Medicine

## 2024-05-27 ENCOUNTER — Encounter: Payer: Self-pay | Admitting: Family Medicine

## 2024-05-27 VITALS — BP 142/82 | HR 77 | Temp 98.5°F | Ht 63.0 in | Wt 172.1 lb

## 2024-05-27 DIAGNOSIS — E010 Iodine-deficiency related diffuse (endemic) goiter: Secondary | ICD-10-CM | POA: Insufficient documentation

## 2024-05-27 DIAGNOSIS — R030 Elevated blood-pressure reading, without diagnosis of hypertension: Secondary | ICD-10-CM

## 2024-05-27 DIAGNOSIS — E559 Vitamin D deficiency, unspecified: Secondary | ICD-10-CM

## 2024-05-27 DIAGNOSIS — Z1211 Encounter for screening for malignant neoplasm of colon: Secondary | ICD-10-CM | POA: Diagnosis not present

## 2024-05-27 DIAGNOSIS — K5909 Other constipation: Secondary | ICD-10-CM

## 2024-05-27 DIAGNOSIS — E041 Nontoxic single thyroid nodule: Secondary | ICD-10-CM | POA: Insufficient documentation

## 2024-05-27 DIAGNOSIS — Z7189 Other specified counseling: Secondary | ICD-10-CM

## 2024-05-27 DIAGNOSIS — K056 Periodontal disease, unspecified: Secondary | ICD-10-CM

## 2024-05-27 DIAGNOSIS — M25551 Pain in right hip: Secondary | ICD-10-CM

## 2024-05-27 DIAGNOSIS — E66811 Obesity, class 1: Secondary | ICD-10-CM

## 2024-05-27 DIAGNOSIS — M8588 Other specified disorders of bone density and structure, other site: Secondary | ICD-10-CM

## 2024-05-27 DIAGNOSIS — M25552 Pain in left hip: Secondary | ICD-10-CM

## 2024-05-27 DIAGNOSIS — E7800 Pure hypercholesterolemia, unspecified: Secondary | ICD-10-CM

## 2024-05-27 MED ORDER — VITAMIN D3 125 MCG (5000 UT) PO CAPS: 5000.0000 [IU] | ORAL_CAPSULE | Freq: Every day | ORAL | Status: AC

## 2024-05-27 NOTE — Progress Notes (Unsigned)
 Ph: [phone] Fax: [phone]   Patient ID: Maria Aguirre, female    DOB: 01-24-1956, 68 y.o.   MRN: [id number]  This visit was conducted in person.  BP (!) 142/82   Pulse 77   Temp 98.5 F (36.9 C) (Oral)   Ht 5' 3 (1.6 m)   Wt 172 lb 2 oz (78.1 kg)   SpO2 97%   BMI 30.49 kg/m   BP Readings from Last 3 Encounters:  05/27/24 (!) 142/82  04/17/23 (!) 144/82  01/28/20 136/80  142/84 on repeat testing.   CC: f/u visit  Subjective:   HPI: Maria Aguirre is a 68 y.o. female presenting on 05/27/2024 for Annual Exam   Saw health advisor 07/2023 for medicare wellness visit. Note reviewed.   Upcoming AMW 08/2024  No results found.  Flowsheet Row Office Visit from 05/27/2024 in [facility] [place]  PHQ-2 Total Score 0       05/27/2024    3:57 PM 08/07/2023   12:03 PM 07/02/2022    9:54 AM 06/28/2022    8:44 AM 06/17/2021    9:21 AM  Fall Risk   Falls in the past year? 0 1 1 0 1  Number falls in past yr: 0 0 0  0  Injury with Fall? 0 0 1  0  Risk for fall due to : No Fall Risks No Fall Risks No Fall Risks  Impaired mobility  Follow up Falls evaluation completed Education provided;Falls prevention discussed Falls prevention discussed;Falls evaluation completed        Data saved with a previous flowsheet row definition   [place] Some caregiver stress noted.   Fmhx CAD - father (stents at age 19s), paternal uncle, paternal grandmother.  Coronary calcium score of zero (04/2023).    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 She cares for great grandsons.  She recently restarted glucosamine chondroitin, vit C, calcium, vit D.  Hip pain some better with regular glucosamine use.   Preventative: COLONOSCOPY 10/2017 - TAs, diverticulosis, rpt 3 yrs  ([facility]) - due, will re-refer.  [facility] Does breast exams at home. # provided to call and schedule s due Well woman exam - prior saw Dr Gorge. Normal pap smears in the past - last 2016. No pelvic pain, vaginal bleeding. Offered final pap, declines for now DEXA 02/2020: T -1.5 spine. Consider updating in 2026 Lung cancer screen - not eligible  Flu shot yearly Td - 05/2009, Tdap done 02/2020 Prevnar-20 04/2023  Pfizer covid 12/04/2019 shingrix - 08/2022, completed 2nd - will send us  dates  Advanced directive planning - daughter RN would be HCPOA. Ok with CPR but would not want prolonged life support if terminal condition. Working on this with attorney. Asked to bring copy when complete  Seat belt use discussed Sunscreen use discussed - no changing moles on skin. Planning to see dermatology  Non smoker  Alcohol - rare  Dentist Q4 mo - periodontist.  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  Activity: walking at work, [employer], [employer]  Diet: good water, fruits/vegetables daily      Relevant past medical, surgical, family and social history reviewed and updated as indicated. Interim medical history since our last visit reviewed. Allergies and medications reviewed and updated. Outpatient Medications Prior to Visit  Medication Sig Dispense Refill   ASPIRIN 81 PO Take 1 tablet by mouth daily.     B COMPLEX VITAMINS PO Take by mouth.     Calcium Carbonate 260 MG CHEW Chew 2 tablets by mouth 2 (two) times daily.     diphenhydrAMINE (BENADRYL) 25 mg capsule Take 25 mg by mouth at bedtime. As needed     Misc Natural Products (GLUCOSAMINE CHOND CMP TRIPLE PO)      vitamin C (ASCORBIC ACID) 500  MG tablet Take 500 mg by mouth 2 (two) times daily.     Wheat Dextrin (BENEFIBER) POWD Use daily PRN constipation     cholecalciferol (VITAMIN D ) 1000 UNITS tablet Take 1,000 Units by mouth 2 (two) times daily. (Patient not taking: Reported on 05/27/2024)     diphenhydramine-acetaminophen (TYLENOL PM) 25-500 MG TABS tablet Take 1 tablet by mouth at bedtime as needed.     No facility-administered medications prior to visit.     Per HPI unless specifically indicated in ROS section below Review of Systems  Objective:  BP (!) 142/82   Pulse 77   Temp 98.5 F (36.9 C) (Oral)   Ht 5' 3 (1.6 m)   Wt 172 lb 2 oz (78.1 kg)   SpO2 97%   BMI 30.49 kg/m   Wt Readings from Last 3 Encounters:  05/27/24 172 lb 2 oz (78.1 kg)  08/08/23 167 lb (75.8 kg)  04/17/23 167 lb 4 oz (75.9 kg)      Physical Exam Vitals and nursing note reviewed.  Constitutional:      Appearance: Normal appearance. She is not ill-appearing.  HENT:     Head: Normocephalic and atraumatic.     Right Ear: Tympanic membrane, ear canal and external ear normal. There is no impacted cerumen.     Left Ear: Tympanic membrane, ear canal and external ear normal. There is no impacted cerumen.     Mouth/Throat:     Mouth: Mucous membranes are moist.     Pharynx: Oropharynx is clear. No oropharyngeal exudate or posterior oropharyngeal erythema.  Eyes:     General:        Right eye: No discharge.        Left eye: No discharge.     Extraocular Movements: Extraocular movements intact.     Conjunctiva/sclera: Conjunctivae normal.     Pupils: Pupils are equal, round, and reactive to light.  Neck:     Thyroid : Thyroid  mass (L sided) and thyromegaly (L sided) present. No thyroid  tenderness.     Vascular: No carotid bruit.   Cardiovascular:     Rate and Rhythm: Normal rate and regular rhythm.     Pulses: Normal pulses.     Heart sounds: Normal heart sounds. No murmur heard. Pulmonary:     Effort: Pulmonary effort is normal. No  respiratory distress.     Breath sounds: Normal breath sounds. No wheezing, rhonchi or rales.  Abdominal:     General: Bowel sounds are normal. There is no distension.     Palpations: Abdomen is soft. There is no mass.     Tenderness: There is no abdominal tenderness.  There is no guarding or rebound.     Hernia: No hernia is present.  Musculoskeletal:     Cervical back: Normal range of motion and neck supple. No rigidity.     Right lower leg: No edema.     Left lower leg: No edema.  Lymphadenopathy:     Cervical: No cervical adenopathy.  Skin:    General: Skin is warm and dry.     Findings: No rash.  Neurological:     General: No focal deficit present.     Mental Status: She is alert. Mental status is at baseline.  Psychiatric:        Mood and Affect: Mood normal.        Behavior: Behavior normal.       Results for orders placed or performed in visit on 04/13/24  TSH   Collection Time: 04/13/24  8:06 AM  Result Value Ref Range   TSH 1.01 0.35 - 5.50 uIU/mL  VITAMIN D  25 Hydroxy (Vit-D Deficiency, Fractures)   Collection Time: 04/13/24  8:06 AM  Result Value Ref Range   VITD 53.12 30.00 - 100.00 ng/mL  Comprehensive metabolic panel with GFR   Collection Time: 04/13/24  8:06 AM  Result Value Ref Range   Sodium 139 135 - 145 mEq/L   Potassium 3.8 3.5 - 5.1 mEq/L   Chloride 104 96 - 112 mEq/L   CO2 30 19 - 32 mEq/L   Glucose, Bld 89 70 - 99 mg/dL   BUN 14 6 - 23 mg/dL   Creatinine, Ser 9.34 0.40 - 1.20 mg/dL   Total Bilirubin 0.6 0.2 - 1.2 mg/dL   Alkaline Phosphatase 80 39 - 117 U/L   AST 14 0 - 37 U/L   ALT 10 0 - 35 U/L   Total Protein 6.4 6.0 - 8.3 g/dL   Albumin 4.1 3.5 - 5.2 g/dL   GFR 10.23 >39.99 mL/min   Calcium 9.2 8.4 - 10.5 mg/dL  Lipid panel   Collection Time: 04/13/24  8:06 AM  Result Value Ref Range   Cholesterol 208 (H) 0 - 200 mg/dL   Triglycerides 21.9 0.0 - 149.0 mg/dL   HDL 44.59 >60.99 mg/dL   VLDL 84.3 0.0 - 59.9 mg/dL   LDL Cholesterol 862  (H) 0 - 99 mg/dL   Total CHOL/HDL Ratio 4    NonHDL 152.25     Assessment & Plan:   Problem List Items Addressed This Visit     Advanced care planning/counseling discussion - Primary (Chronic)   Advanced directive planning - daughter RN would be HCPOA. Ok with CPR but would not want prolonged life support if terminal condition. Working on this with attorney. Asked to bring copy when complete       Vitamin D  deficiency   Continues 5k units daily.       HYPERCHOLESTEROLEMIA   Chronic, mild off medication.  Reassuring calcium score 04/2023 - will remain off statin The 10-year ASCVD risk score (Arnett DK, et al., 2019) is: 10.5%   Values used to calculate the score:     Age: 39 years     Clincally relevant sex: Female     Is Non-Hispanic African American: No     Diabetic: No     Tobacco smoker: No     Systolic Blood Pressure: 142 mmHg     Is BP treated: No     HDL Cholesterol: 55.4 mg/dL     Total Cholesterol: 208 mg/dL  Periodontal disease   Regularly followed by periodontist       Osteopenia   Continue dietary calcium intake, calcium supplement and vit D 5000 units daily.       Obesity, Class I, BMI 30-34.9   Reviewed healthy diet and lifestyle changes to effect sustainable weight loss.        Elevated blood pressure reading in office without diagnosis of hypertension   BP again elevated today. Reviewed diet/lifestyle choices to control BP DASH diet handout provided BP log sheet provided to start monitoring blood pressures at home.  Update if BP staying elevated at home - low threshold to start antihypertensive.       Chronic constipation   Chronic, stable with benefiber and miralax use.       Thyromegaly   Newly noted, left sided, ?goiter vs other. TSH normal. + fmhx  thyroid  disease - mother and daughter. Will order thyroid  US , # provided for pt to call and schedule appt at [facility].       Relevant Orders   US  THYROID    Bilateral hip pain   Did not  examine today - she notes some improvement with restarting glucosamine chondroitin.  Suspect component of hip osteoarthritis. Consider return for evaluation, imaging.       Other Visit Diagnoses       Special screening for malignant neoplasms, colon       Relevant Orders   Ambulatory referral to Gastroenterology        Meds ordered this encounter  Medications   Cholecalciferol (VITAMIN D3) 125 MCG (5000 UT) CAPS    Sig: Take 1 capsule (5,000 Units total) by mouth daily.    Orders Placed This Encounter  Procedures   US  THYROID     Standing Status:   Future    Expiration Date:   05/27/2025    Reason for Exam (SYMPTOM  OR DIAGNOSIS REQUIRED):   L thyroid  enlargement    Preferred imaging location?:   [facility]   Ambulatory referral to Gastroenterology    Referral Priority:   Routine    Referral Type:   Consultation    Referral Reason:   Specialty Services Required    Number of Visits Requested:   1    Patient Instructions  We will refer you to [facility] for colonoscopy. Call to schedule mammogram at your convenience: [facility] [phone].  Send us  date of both shingles shots.  Bring us  copy of advanced directives when completed.  Will order thyroid  ultrasound - you may call [phone] to schedule appointment   Your goal blood pressure is <140/90. Work on low salt/sodium diet - goal <2 grams (2,000mg ) per day. Eat a diet high in fruits/vegetables and whole grains.  Look into mediterranean and DASH diet. Goal activity is 15min/wk of moderate intensity exercise.  This can be split into 30 minute chunks.  If you are not at this level, you can start with smaller 10-15 min increments and slowly build up activity. Look at www.heart.org for more resources  Start monitoring blood pressures at home - BP log sheet provided today. Let ms know if >140/90.   Follow up plan: Return in about 1 year (around 05/27/2025) for medicare wellness visit,  follow up visit.  Anton Blas, MD

## 2024-05-27 NOTE — Patient Instructions (Addendum)
 We will refer you to [facility] for colonoscopy. Call to schedule mammogram at your convenience: [facility] [phone].  Send us  date of both shingles shots.  Bring us  copy of advanced directives when completed.  Will order thyroid  ultrasound - you may call [phone] to schedule appointment   Your goal blood pressure is <140/90. Work on low salt/sodium diet - goal <2 grams (2,000mg ) per day. Eat a diet high in fruits/vegetables and whole grains.  Look into mediterranean and DASH diet. Goal activity is 124min/wk of moderate intensity exercise.  This can be split into 30 minute chunks.  If you are not at this level, you can start with smaller 10-15 min increments and slowly build up activity. Look at www.heart.org for more resources  Start monitoring blood pressures at home - BP log sheet provided today. Let ms know if >140/90.

## 2024-05-28 DIAGNOSIS — M25551 Pain in right hip: Secondary | ICD-10-CM | POA: Insufficient documentation

## 2024-05-28 NOTE — Assessment & Plan Note (Addendum)
 Continues 5k units daily.

## 2024-05-28 NOTE — Assessment & Plan Note (Addendum)
 Newly noted, left sided, ?goiter vs other. TSH normal. + fmhx  thyroid  disease - mother and daughter. Will order thyroid  US , # provided for pt to call and schedule appt at [facility].

## 2024-05-28 NOTE — Assessment & Plan Note (Signed)
 Chronic, stable with benefiber and miralax use.

## 2024-05-28 NOTE — Assessment & Plan Note (Signed)
 Reviewed healthy diet and lifestyle changes to effect sustainable weight loss.

## 2024-05-28 NOTE — Assessment & Plan Note (Signed)
 BP again elevated today. Reviewed diet/lifestyle choices to control BP DASH diet handout provided BP log sheet provided to start monitoring blood pressures at home.  Update if BP staying elevated at home - low threshold to start antihypertensive.

## 2024-05-28 NOTE — Assessment & Plan Note (Signed)
 Regularly followed by periodontist

## 2024-05-28 NOTE — Assessment & Plan Note (Addendum)
 Chronic, mild off medication.  Reassuring calcium score 04/2023 - will remain off statin The 10-year ASCVD risk score (Arnett DK, et al., 2019) is: 10.5%   Values used to calculate the score:     Age: 68 years     Clincally relevant sex: Female     Is Non-Hispanic African American: No     Diabetic: No     Tobacco smoker: No     Systolic Blood Pressure: 142 mmHg     Is BP treated: No     HDL Cholesterol: 55.4 mg/dL     Total Cholesterol: 208 mg/dL

## 2024-05-28 NOTE — Assessment & Plan Note (Signed)
 Advanced directive planning - daughter RN would be HCPOA. Ok with CPR but would not want prolonged life support if terminal condition. Working on this with attorney. Asked to bring copy when complete

## 2024-05-28 NOTE — Assessment & Plan Note (Addendum)
 Did not examine today - she notes some improvement with restarting glucosamine chondroitin.  Suspect component of hip osteoarthritis. Consider return for evaluation, imaging.

## 2024-05-28 NOTE — Assessment & Plan Note (Signed)
 Continue dietary calcium intake, calcium supplement and vit D 5000 units daily.

## 2024-06-03 ENCOUNTER — Ambulatory Visit
Admission: RE | Admit: 2024-06-03 | Discharge: 2024-06-03 | Disposition: A | Discharge status: Home / Self Care | Source: Ambulatory Visit | Attending: Family Medicine | Admitting: Family Medicine

## 2024-06-03 DIAGNOSIS — E010 Iodine-deficiency related diffuse (endemic) goiter: Secondary | ICD-10-CM | POA: Insufficient documentation

## 2024-06-04 ENCOUNTER — Other Ambulatory Visit: Payer: Self-pay

## 2024-06-04 ENCOUNTER — Telehealth: Payer: Self-pay

## 2024-06-04 DIAGNOSIS — Z860100 Personal history of colon polyps, unspecified: Secondary | ICD-10-CM

## 2024-06-04 NOTE — Telephone Encounter (Signed)
 Gastroenterology Pre-Procedure Review  Request Date: 10/12/24 Requesting Physician: Dr. Jinny  PATIENT REVIEW QUESTIONS: The patient responded to the following health history questions as indicated:    1. Are you having any GI issues? no 2. Do you have a personal history of Polyps? yes (last colonoscopy performed by Dr. Damien Oct 22 2017 recommended repeat in 3 years) 3. Do you have a family history of Colon Cancer or Polyps? yes (grandmother colon cancer deceased in 32) 4. Diabetes Mellitus? no 5. Joint replacements in the past 12 months?no 6. Major health problems in the past 3 months?no 7. Any artificial heart valves, MVP, or defibrillator?no    MEDICATIONS & ALLERGIES:    Patient reports the following regarding taking any anticoagulation/antiplatelet therapy:   Plavix, Coumadin, Eliquis, Xarelto, Lovenox, Pradaxa, Brilinta, or Effient? no Aspirin? yes (81 mg daily)  Patient confirms/reports the following medications:  Current Outpatient Medications  Medication Sig Dispense Refill   ASPIRIN 81 PO Take 1 tablet by mouth daily.     B COMPLEX VITAMINS PO Take by mouth.     Calcium Carbonate 260 MG CHEW Chew 2 tablets by mouth 2 (two) times daily.     Cholecalciferol (VITAMIN D3) 125 MCG (5000 UT) CAPS Take 1 capsule (5,000 Units total) by mouth daily.     diphenhydrAMINE (BENADRYL) 25 mg capsule Take 25 mg by mouth at bedtime. As needed     Misc Natural Products (GLUCOSAMINE CHOND CMP TRIPLE PO)      vitamin C (ASCORBIC ACID) 500 MG tablet Take 500 mg by mouth 2 (two) times daily.     Wheat Dextrin (BENEFIBER) POWD Use daily PRN constipation     No current facility-administered medications for this visit.    Patient confirms/reports the following allergies:  Allergies  Allergen Reactions   Shellfish Allergy Hives   Poison Ivy Extract    Tilactase Rash    Face    No orders of the defined types were placed in this encounter.   AUTHORIZATION INFORMATION Primary  Insurance: 1D#: Group #:  Secondary Insurance: 1D#: Group #:  SCHEDULE INFORMATION: Date: 10/12/24 Time: Location: [facility]

## 2024-06-10 ENCOUNTER — Encounter: Payer: Self-pay | Admitting: *Deleted

## 2024-06-10 ENCOUNTER — Ambulatory Visit: Payer: Self-pay | Admitting: Family Medicine

## 2024-06-10 DIAGNOSIS — E041 Nontoxic single thyroid nodule: Secondary | ICD-10-CM

## 2024-06-10 NOTE — Telephone Encounter (Signed)
 Last read by Katheryn KANDICE Lowers at 7:25AM on 06/10/2024.

## 2024-06-16 NOTE — Progress Notes (Signed)
 Patient for US  guided FNA LT thyroid  nodule biopsy on Wed 06/17/24, I called and spoke with the patient on the phone and gave pre-procedure instructions. Pt was made aware to be here at 12:30p and check in at the [facility] registration desk. Pt stated understanding. Called 06/16/24

## 2024-06-17 ENCOUNTER — Ambulatory Visit
Admission: RE | Admit: 2024-06-17 | Discharge: 2024-06-17 | Disposition: A | Discharge status: Home / Self Care | Source: Ambulatory Visit | Attending: Family Medicine | Admitting: Family Medicine

## 2024-06-17 VITALS — BP 149/76 | HR 58

## 2024-06-17 DIAGNOSIS — E041 Nontoxic single thyroid nodule: Secondary | ICD-10-CM | POA: Insufficient documentation

## 2024-06-17 MED ORDER — LIDOCAINE HCL (PF) 1 % IJ SOLN
10.0000 mL | Freq: Once | INTRAMUSCULAR | Status: AC
  Administered 2024-06-17: 10 mL via INTRADERMAL

## 2024-06-17 NOTE — Procedures (Signed)
 PROCEDURE SUMMARY:  Using direct ultrasound guidance, 5 passes were made using 25 g needles into the nodule within the left lobe of the thyroid .   Ultrasound was used to confirm needle placements on all occasions.   EBL = trace  Specimens were sent to Pathology for analysis.  See procedure note under Imaging tab in Epic for full procedure details.  Electronically Signed: Auria Mckinlay M Benjamin Merrihew, PA-C 06/17/2024, 2:14 PM

## 2024-06-18 ENCOUNTER — Ambulatory Visit: Payer: Self-pay | Admitting: Family Medicine

## 2024-06-18 LAB — CYTOLOGY - NON PAP

## 2024-06-19 ENCOUNTER — Ambulatory Visit: Payer: Self-pay | Admitting: Family Medicine

## 2024-06-19 DIAGNOSIS — E041 Nontoxic single thyroid nodule: Secondary | ICD-10-CM

## 2024-09-09 MED ORDER — NA SULFATE-K SULFATE-MG SULF 17.5-3.13-1.6 GM/177ML PO SOLN: 1.0000 | Freq: Once | ORAL | 0 refills | Status: AC

## 2024-09-09 NOTE — Addendum Note (Signed)
 Addended by: LANNIE ANDREA GRADE on: 09/09/2024 11:01 AM   Modules accepted: Orders

## 2024-09-28 ENCOUNTER — Telehealth: Payer: Self-pay

## 2024-09-28 NOTE — Telephone Encounter (Addendum)
 Patient called stating that she will need to reschedule her colonoscopy because her husband is sick. Therefore, she is to reschedule her colonoscopy to 11/30/2024. I then called the the endo unit and spoke to Vickie and she stated that it would be changed.

## 2024-10-20 ENCOUNTER — Encounter

## 2024-10-26 ENCOUNTER — Telehealth: Payer: Self-pay | Admitting: Family Medicine

## 2024-10-26 ENCOUNTER — Encounter: Payer: Self-pay | Admitting: Family Medicine

## 2024-10-26 NOTE — Telephone Encounter (Signed)
 Pt reqeusting a call back to cancel and reschedule procedure [phone]

## 2024-10-27 ENCOUNTER — Telehealth: Payer: Self-pay

## 2024-10-27 NOTE — Telephone Encounter (Signed)
 Please read message from 10/27/2024.

## 2024-10-27 NOTE — Telephone Encounter (Signed)
 Pt states she need to r/s procedure the patient said she call yesterday and was told the procedure was canceled but I don't show it canceled.

## 2024-10-27 NOTE — Telephone Encounter (Signed)
 Called patient back and she stated that she needed to reschedule her procedure to 12/28/2024 with Dr. Theophilus from 11/30/2024 with Dr.Wohl.

## 2024-11-20 ENCOUNTER — Ambulatory Visit

## 2024-12-28 ENCOUNTER — Ambulatory Visit: Admit: 2024-12-28

## 2024-12-28 SURGERY — COLONOSCOPY
Anesthesia: General

## 2025-03-04 ENCOUNTER — Ambulatory Visit

## 2025-05-26 ENCOUNTER — Other Ambulatory Visit

## 2025-06-02 ENCOUNTER — Ambulatory Visit: Admitting: Family Medicine
